# Patient Record
Sex: Female | Born: 1950
Health system: Southern US, Community
[De-identification: ages and names within clinical notes are randomized; demographics above are authoritative.]

## PROBLEM LIST (undated history)

## (undated) DIAGNOSIS — I10 Essential (primary) hypertension: Secondary | ICD-10-CM

## (undated) HISTORY — DX: Essential (primary) hypertension: I10

---

## 2006-06-30 HISTORY — PX: REPLACEMENT TOTAL KNEE: SUR1224

## 2007-05-21 ENCOUNTER — Inpatient Hospital Stay (HOSPITAL_COMMUNITY): Admission: RE | Admit: 2007-05-21 | Discharge: 2007-05-23 | Payer: Self-pay | Admitting: Orthopedic Surgery

## 2010-11-12 NOTE — Op Note (Signed)
Cynthia Knapp, Cynthia Knapp              ACCOUNT NO.:  [id number]   MEDICAL RECORD NO.:  [id number]          PATIENT TYPE:  INP   LOCATION:  0004                         FACILITY:  [facility]   PHYSICIAN:  John L. Rendall, M.D.  DATE OF BIRTH:  1951/04/27   DATE OF PROCEDURE:  05/21/2007  DATE OF DISCHARGE:                               OPERATIVE REPORT   PREOPERATIVE DIAGNOSIS:  Osteoarthritis, right knee, with deformity.   SURGICAL PROCEDURES:  Right LCS total knee arthroplasty with computer  navigation assistance.   POSTOPERATIVE DIAGNOSIS:  Osteoarthritis, right knee, with deformity.   SURGEON:  John L. Rendall, M.D.   ASSISTANT:  Rexene Edison, P.A.-C.   ANESTHESIA:  General.   PATHOLOGY:  Bone on bone medial compartment right knee with varus  deformity and flexion contracture.  Due to the flexion contracture and  varus deformity and young age of the patient, age 52, computer  navigation was felt highly indicated to optimize the results of her  prosthesis.   PROCEDURE:  Under general anesthesia with the femoral nerve block, the  right leg is prepared with DuraPrep and draped as a sterile field.  A  sterile tourniquet is placed on the proximal thigh, the leg is wrapped  out with an Esmarch, and the tourniquet is used to 350 mm.  Midline  incision is made, the patella is everted, and the knee is debrided in  preparation for computer mapping.  Schanz pins are placed in the  superior medial tibia through accessory stab wounds and distal medial  femur.  The arrays are set up.  The femoral head is identified.  Medial  and lateral malleoli are identified.  Proximal tibia is then mapped as  is the distal femur, all within less than 1 mm of reproducible accuracy.  Once this was completed, the computer was used for proximal tibial  resection, taking 10 mm from the high side.  The ligamentous balancer is  then inserted, and significant release of the MCL and medial capsular  sleeve is required  to correct her 7 degrees of varus deformity.  Also  significant release of the cruciates and posterior capsule is required  to extend the knee fully.  Once this is completed and the ligaments are  balanced, attention is then turned to the distal femur, and the anterior  and posterior flare of the distal femur are resected first, using the  computer, within again 1 degree of anatomic accuracy as regards to  rotation.  The distal femoral cut is then made, also within 1 degree of  anatomic accuracy.  Flexion and extension gaps are balanced at slightly  more than 10 mm.  At this point, the recessing guide is used, and the  femoral side is then essentially ready.  Lamina spreader is inserted.  Remnants of menisci and cruciates are debrided.  Proximal tibia is  exposed and sized as a number 2.5.  The femur is sized as a medium.  Once the tibial tray is inserted and the center peg hole is impacted,  the trial reduction of a number 2.5 tibia with a 10 bearing and  a medium  femoral component are used, this reveals excellent stability in  extension but slight laxity in flexion to drawer testing.  Changing out  the bearing for a 12.5 bearing again gives excellent stability in  extension and no hyperextension, and in flexion, stability is much  enhanced.  Anatomic accuracy is within 1.5-mm of perfectly straight line  from hip to ankle, and knee extension is within 2 degrees.  At this,  point the computer arrays are taken down.  The bony surfaces are  prepared with pulse irrigation, and permanent components are then  cemented in place.  Once the cement is hardened, excess cement is  removed, and the tourniquet is let down at an hour and 15 minutes.  The  multiple small vessels are cauterized.  Medium Hemovac is inserted.  The knee is then closed in layers with #1 Tycron, #1 Vicryl, 2-0 Vicryl  and skin clips.  Operative time approximately an hour and 30 minutes.  The patient tolerated the procedure  well and returned to recovery in  good condition.      John L. Rendall, M.D.  Electronically Signed     JLR/MEDQ  D:  05/21/2007  T:  05/22/2007  Job:  [id number]

## 2011-01-10 ENCOUNTER — Other Ambulatory Visit: Payer: Self-pay | Admitting: Orthopedic Surgery

## 2011-01-10 ENCOUNTER — Other Ambulatory Visit (HOSPITAL_COMMUNITY): Payer: Self-pay | Admitting: Orthopedic Surgery

## 2011-01-10 ENCOUNTER — Ambulatory Visit (HOSPITAL_COMMUNITY)
Admission: RE | Admit: 2011-01-10 | Discharge: 2011-01-10 | Disposition: A | Discharge status: Home / Self Care | Payer: Managed Care, Other (non HMO) | Source: Ambulatory Visit | Attending: Orthopedic Surgery | Admitting: Orthopedic Surgery

## 2011-01-10 ENCOUNTER — Encounter (HOSPITAL_COMMUNITY): Payer: Managed Care, Other (non HMO)

## 2011-01-10 DIAGNOSIS — Z01811 Encounter for preprocedural respiratory examination: Secondary | ICD-10-CM

## 2011-01-10 DIAGNOSIS — M1710 Unilateral primary osteoarthritis, unspecified knee: Secondary | ICD-10-CM | POA: Insufficient documentation

## 2011-01-10 DIAGNOSIS — I10 Essential (primary) hypertension: Secondary | ICD-10-CM | POA: Insufficient documentation

## 2011-01-10 DIAGNOSIS — Z01812 Encounter for preprocedural laboratory examination: Secondary | ICD-10-CM | POA: Insufficient documentation

## 2011-01-10 DIAGNOSIS — Z01818 Encounter for other preprocedural examination: Secondary | ICD-10-CM | POA: Insufficient documentation

## 2011-01-10 DIAGNOSIS — IMO0002 Reserved for concepts with insufficient information to code with codable children: Secondary | ICD-10-CM | POA: Insufficient documentation

## 2011-01-10 LAB — URINALYSIS, ROUTINE W REFLEX MICROSCOPIC
Hgb urine dipstick: NEGATIVE
Ketones, ur: NEGATIVE mg/dL
Protein, ur: NEGATIVE mg/dL
pH: 7 (ref 5.0–8.0)

## 2011-01-10 LAB — DIFFERENTIAL
Eosinophils Absolute: 0.1 10*3/uL (ref 0.0–0.7)
Eosinophils Relative: 2 % (ref 0–5)
Lymphocytes Relative: 37 % (ref 12–46)
Monocytes Absolute: 0.6 10*3/uL (ref 0.1–1.0)
Monocytes Relative: 8 % (ref 3–12)
Neutro Abs: 4.5 10*3/uL (ref 1.7–7.7)

## 2011-01-10 LAB — CBC
Platelets: 263 10*3/uL (ref 150–400)
RDW: 13.1 % (ref 11.5–15.5)

## 2011-01-10 LAB — COMPREHENSIVE METABOLIC PANEL
ALT: 25 U/L (ref 0–35)
AST: 23 U/L (ref 0–37)
Alkaline Phosphatase: 93 U/L (ref 39–117)
BUN: 18 mg/dL (ref 6–23)
Calcium: 10 mg/dL (ref 8.4–10.5)
GFR calc Af Amer: >60 mL/min (ref >60)
Sodium: 139 mEq/L (ref 135–145)
Total Protein: 7.9 g/dL (ref 6.0–8.3)

## 2011-01-10 LAB — SURGICAL PCR SCREEN: MRSA, PCR: NEGATIVE

## 2011-01-10 LAB — URINE MICROSCOPIC-ADD ON

## 2011-01-10 LAB — APTT: aPTT: 30 seconds (ref 24–37)

## 2011-01-10 LAB — PROTIME-INR: Prothrombin Time: 12.8 seconds (ref 11.6–15.2)

## 2011-01-16 ENCOUNTER — Inpatient Hospital Stay (HOSPITAL_COMMUNITY)
Admission: RE | Admit: 2011-01-16 | Discharge: 2011-01-18 | DRG: 470 | Disposition: A | Discharge status: Home Health | Payer: Managed Care, Other (non HMO) | Source: Ambulatory Visit | Attending: Orthopedic Surgery | Admitting: Orthopedic Surgery

## 2011-01-16 DIAGNOSIS — M1710 Unilateral primary osteoarthritis, unspecified knee: Principal | ICD-10-CM | POA: Diagnosis present

## 2011-01-16 DIAGNOSIS — Z01812 Encounter for preprocedural laboratory examination: Secondary | ICD-10-CM

## 2011-01-16 DIAGNOSIS — Z96659 Presence of unspecified artificial knee joint: Secondary | ICD-10-CM

## 2011-01-16 DIAGNOSIS — D62 Acute posthemorrhagic anemia: Secondary | ICD-10-CM | POA: Diagnosis not present

## 2011-01-16 DIAGNOSIS — Z6841 Body Mass Index (BMI) 40.0 and over, adult: Secondary | ICD-10-CM

## 2011-01-16 DIAGNOSIS — I10 Essential (primary) hypertension: Secondary | ICD-10-CM | POA: Diagnosis present

## 2011-01-16 LAB — TYPE AND SCREEN

## 2011-01-17 DIAGNOSIS — M79609 Pain in unspecified limb: Secondary | ICD-10-CM

## 2011-01-17 LAB — BASIC METABOLIC PANEL
BUN: 10 mg/dL (ref 6–23)
Calcium: 8.7 mg/dL (ref 8.4–10.5)
Chloride: 106 mEq/L (ref 96–112)
Creatinine, Ser: 0.67 mg/dL (ref 0.50–1.10)
GFR calc Af Amer: >60 mL/min (ref >60)
GFR calc non Af Amer: >60 mL/min (ref >60)
Glucose, Bld: 132 mg/dL — ABNORMAL HIGH (ref 70–99)
Potassium: 4.4 mEq/L (ref 3.5–5.1)

## 2011-01-17 LAB — CBC
MCV: 88.6 fL (ref 78.0–100.0)
RBC: 3.69 MIL/uL — ABNORMAL LOW (ref 3.87–5.11)

## 2011-01-18 LAB — BASIC METABOLIC PANEL
CO2: 28 mEq/L (ref 19–32)
Calcium: 8.3 mg/dL — ABNORMAL LOW (ref 8.4–10.5)
Chloride: 109 mEq/L (ref 96–112)
Creatinine, Ser: 0.65 mg/dL (ref 0.50–1.10)

## 2011-01-18 LAB — CBC
MCH: 29.6 pg (ref 26.0–34.0)
RBC: 3.21 MIL/uL — ABNORMAL LOW (ref 3.87–5.11)
WBC: 9.1 10*3/uL (ref 4.0–10.5)

## 2011-01-23 NOTE — Op Note (Signed)
Cynthia Knapp, Cynthia Knapp              ACCOUNT NO.:  [id number]  MEDICAL RECORD NO.:  [id number]  LOCATION:  0001                         FACILITY:  [facility]  PHYSICIAN:  Cynthia Knapp, M.D.  DATE OF BIRTH:  18-Nov-1950  DATE OF PROCEDURE:  01/16/2011 DATE OF DISCHARGE:                              OPERATIVE REPORT   PREOPERATIVE DIAGNOSIS:  End-stage osteoarthritis, left knee.  SURGICAL PROCEDURES:  Left LCS total knee arthroplasty with computer navigation assistance.  POSTOPERATIVE DIAGNOSIS:  End-stage osteoarthritis, left knee and morbid obesity with body mass index of 43.  SURGEON:  Cynthia Knapp, M.D.  ASSISTANT:  Cynthia Knapp, P.A.  ANESTHESIA:  General.  PATHOLOGY:  The patient has a worn out knee with 7 degrees of varus on the computer and a 5-degree flexion contracture.  In addition, she has morbid obesity with BMI of 42 and a fat layer 4 inches thick over the front of the thigh just above the patella. PROCEDURE:  Under general anesthesia, the left leg is prepared with DuraPrep and draped as a sterile field.  A midline incision was made after application of the sterile tourniquet and Esmarch.  The patella cannot be everted without making a pouch under the fatty layer that is virtually 4 inches thick with it everted in the fatty layer.  The front of the knee is appropriately exposed.  Debridement is then done in preparation for computer mapping.  Schanz pins were placed in punctures of the superior medial tibia and in the distal femur.  The arrays were set up, the femoral heads identified as well as the malleoli.  The knee is then mapped with 1 mm of reproducible accuracy.  Using the first computer guide, the proximal tibia resection is carried out within 1 degree of anatomic alignment.  The flexion gap was then measured.  With the proximal tibia resection done, the tensioner is inserted and the longitudinal alignment is adjusted to within 1 degree of anatomic.   The attention is then turned to the femur.  The anterior and posterior flare of the distal femur are resected for a size medium femoral component. The flexion gap is tested at 10 mm.  The distal femoral cut was then made and this was within 1 degree of anatomic alignment as well. Attention was then turned to debridement of the menisci.  Spurs off the back of the femur and debridement of the intercondylar space.  With this complete, the recessing guide is then used.  With this completed, attention was turned to the tibia.  It is sized at 2.5, center peg hole with keel are inserted and the trial tibial tray, 10 bearing and medium femur are all inserted.  There was excellent fit and alignment is within 1 degree of anatomic and flexion is done easily to about 115 degrees. At this point, the patella was osteotomized, three peg hole patella used.  The patella trial was inserted and the stability and alignment are unchanged.  The arrays were taken down.  Permanent components were obtained.  Bony surfaces prepared with pulse irrigation.  All components cemented in place.  While the cement was hardening, a synovectomy of the anterior compartments were done.  The electrocautery was used and bone wax in preparation for a tourniquet let down.  This was done at 67 minutes.  Once the cement was hardened and excess cement was removed after cauterizing multiple small vessels and inserting a medium Hemovac drain, the knee was then closed in layers with #1 Tycron, two layers proximally of #1 Vicryl and the third layer of 2-0 Vicryl and then skin clips.  Blood loss approximately 200 mL.  The patient tolerated the procedure well and returned to recovery in satisfactory condition.     Cynthia Knapp, M.D.     Cynthia Knapp  D:  01/16/2011  T:  01/16/2011  Job:  [id number]  Electronically Signed by Cynthia Knapp M.D. on 01/23/2011 03:47:23 PM

## 2011-02-06 NOTE — Discharge Summary (Signed)
Cynthia Knapp, BURGGRAF              ACCOUNT NO.:  [id number]  MEDICAL RECORD NO.:  [id number]  LOCATION:  1615                         FACILITY:  [facility]  PHYSICIAN:  Raylin Winer L. Rendall, M.D.  DATE OF BIRTH:  07/20/1950  DATE OF ADMISSION:  01/16/2011 DATE OF DISCHARGE:  01/18/2011                              DISCHARGE SUMMARY   ADMISSION DIAGNOSES: 1. End-stage osteoarthritis, left knee, status post right total knee     arthroplasty. 2. Obesity. 3. Hypertension. 4. Hypercholesterolemia. 5. History of gout.  DISCHARGE DIAGNOSES: 1. End-stage osteoarthritis, left knee, status post left total knee     arthroplasty. 2. Acute blood loss anemia secondary to surgery. 3. Obesity. 4. Hypertension. 5. Hypercholesterolemia. 6. History of gout.  SURGICAL PROCEDURES:  On January 16, 2011, Cynthia Knapp underwent a left total knee arthroplasty with computer navigation by Dr. Jonny Ruiz L. Rendall, assisted by Arnoldo Morale PA-C.  She had a primary femoral component, cemented size medium, left placed with a tibial tray rotating platform NBT keel size 2.5 cemented.  A DePuy LCS complete metal back patella, cemented size medium, with an LCS complete tibial insert rotating platform, 10-mm thickness.  COMPLICATIONS:  None.  CONSULTS:  Physical therapy and occupational therapy consult, January 17, 2011.  HISTORY OF PRESENT ILLNESS:  This 60 year old white female patient with history of a right total knee presented with a 2-year history of gradual onset of progressive left knee pain.  The left knee pain is now constant ache to sharp sensation over the anterior medial knee with radiation into the mid thigh and calf.  It increases with walking and decreases with elevation.  The knee pops, catches, locks, gives away and keeps her up at night.  She has failed conservative treatment and because of that, she is presenting for a left knee replacement.  HOSPITAL COURSE:  Cynthia Knapp tolerated her surgical  procedure well without immediate postoperative complications.  She was transferred to the orthopedic floor.  Postop day #1, she was afebrile, vitals were stable.  Hemoglobin 11, hematocrit 32.7.  She was complaining of some calf pain, so Doppler was ordered which was negative for DVT.  She is taken off her PCA, weaned off her oxygen and started on therapy.  Postop day #2, she was doing very well.  She again remained afebrile, hemoglobin 9.5, hematocrit 28.5.  She was moving well with therapy and was felt to be ready for discharge home and was discharged home later that day.  DISCHARGE INSTRUCTIONS:  DIET:  She is to resume her regular prehospitalization diet.  MEDICATIONS:  Please see the patient medication discharge instruction sheet for complete documentation of the medications.  However, we did start her on Celebrex, Robaxin, Percocet and Xarelto.  We did have her hold her home Lodine that she had been on preoperatively.  ACTIVITY:  She is to be out of bed, weightbearing as tolerated on the left leg with use of a walker.  No lifting or driving for 6 weeks. Please see the white total joint discharge sheet for further activity instructions.  WOUND CARE:  Please see the white total joint discharge sheet for further wound care instructions.  FOLLOWUP:  She is to follow  up with Dr. Priscille Kluver in his office on Tuesday, January 28, 2011 and needs to call [phone] for that appointment. She is arranged for home health PT per [facility].  LABORATORY DATA:  Hemoglobin/hematocrit ranged from 11 and 32.7 on July 20 to 9.5 and 28.5 on the July 21.  Glucose ranged from 132 on July 20 to 106 on July 21.  Calcium dropped to a low of 8.3 on July 21.  All other laboratory studies were within normal limits.     Legrand Pitts Duffy, P.A.   ______________________________ Carlisle Beers. Priscille Kluver, M.D.    KED/MEDQ  D:  01/29/2011  T:  01/29/2011  Job:  [id number]  Electronically Signed by  Otilio Jefferson. on 02/06/2011 02:54:25 PM Electronically Signed by Erasmo Leventhal M.D. on 02/06/2011 03:05:39 PM

## 2011-04-08 LAB — URINALYSIS, ROUTINE W REFLEX MICROSCOPIC
Bilirubin Urine: NEGATIVE
Bilirubin Urine: NEGATIVE
Glucose, UA: NEGATIVE
Hgb urine dipstick: NEGATIVE
Ketones, ur: NEGATIVE
Ketones, ur: NEGATIVE
Nitrite: NEGATIVE
Protein, ur: NEGATIVE
Specific Gravity, Urine: 1.006
Urobilinogen, UA: 0.2
Urobilinogen, UA: 0.2
pH: 5.5

## 2011-04-08 LAB — URINE CULTURE
Colony Count: NO GROWTH
Culture: NO GROWTH

## 2011-04-08 LAB — COMPREHENSIVE METABOLIC PANEL
ALT: 22
AST: 21
Albumin: 4.2
BUN: 8
CO2: 30
Creatinine, Ser: 0.78
GFR calc non Af Amer: >60
Glucose, Bld: 103 — ABNORMAL HIGH
Total Bilirubin: 0.9

## 2011-04-08 LAB — CBC
HCT: 43.3
MCV: 88.2
Platelets: 232
Platelets: 268
RBC: 3.43 — ABNORMAL LOW
RBC: 4.94
RDW: 13.8
WBC: 10.9 — ABNORMAL HIGH
WBC: 11.2 — ABNORMAL HIGH
WBC: 9.3

## 2011-04-08 LAB — BASIC METABOLIC PANEL
BUN: 6
Calcium: 8.6
Chloride: 103
Creatinine, Ser: 0.81
Creatinine, Ser: 0.81
GFR calc Af Amer: >60
GFR calc non Af Amer: >60
Potassium: 4.4
Sodium: 135

## 2011-04-08 LAB — CROSSMATCH
ABO/RH(D): A POS
Antibody Screen: NEGATIVE

## 2011-04-08 LAB — APTT: aPTT: 28

## 2011-04-08 LAB — DIFFERENTIAL
Eosinophils Relative: 1
Monocytes Absolute: 0.6
Monocytes Relative: 7
Neutro Abs: 5.1
Neutrophils Relative %: 55

## 2013-06-02 ENCOUNTER — Other Ambulatory Visit (HOSPITAL_COMMUNITY)
Admission: RE | Admit: 2013-06-02 | Discharge: 2013-06-02 | Disposition: A | Discharge status: Home / Self Care | Payer: No Typology Code available for payment source | Source: Ambulatory Visit | Attending: Family Medicine | Admitting: Family Medicine

## 2013-06-02 ENCOUNTER — Other Ambulatory Visit: Payer: Self-pay | Admitting: Family Medicine

## 2013-06-02 DIAGNOSIS — Z124 Encounter for screening for malignant neoplasm of cervix: Secondary | ICD-10-CM | POA: Insufficient documentation

## 2013-06-02 DIAGNOSIS — Z1151 Encounter for screening for human papillomavirus (HPV): Secondary | ICD-10-CM | POA: Insufficient documentation

## 2014-06-09 ENCOUNTER — Other Ambulatory Visit: Payer: Self-pay | Admitting: Gastroenterology

## 2014-06-09 DIAGNOSIS — R1310 Dysphagia, unspecified: Secondary | ICD-10-CM

## 2014-06-15 ENCOUNTER — Other Ambulatory Visit: Payer: Managed Care, Other (non HMO)

## 2017-09-11 ENCOUNTER — Other Ambulatory Visit: Payer: Self-pay | Admitting: Obstetrics & Gynecology

## 2017-09-11 DIAGNOSIS — Z1231 Encounter for screening mammogram for malignant neoplasm of breast: Secondary | ICD-10-CM

## 2017-09-29 ENCOUNTER — Encounter: Payer: Self-pay | Admitting: Radiology

## 2017-09-29 ENCOUNTER — Ambulatory Visit
Admission: RE | Admit: 2017-09-29 | Discharge: 2017-09-29 | Disposition: A | Discharge status: Home / Self Care | Payer: Commercial Managed Care - PPO | Source: Ambulatory Visit | Attending: Obstetrics & Gynecology | Admitting: Obstetrics & Gynecology

## 2017-09-29 DIAGNOSIS — Z1231 Encounter for screening mammogram for malignant neoplasm of breast: Secondary | ICD-10-CM

## 2017-10-22 ENCOUNTER — Ambulatory Visit: Payer: Commercial Managed Care - PPO

## 2018-08-16 DIAGNOSIS — Z1211 Encounter for screening for malignant neoplasm of colon: Secondary | ICD-10-CM | POA: Diagnosis not present

## 2018-08-16 DIAGNOSIS — F325 Major depressive disorder, single episode, in full remission: Secondary | ICD-10-CM | POA: Diagnosis not present

## 2018-08-16 DIAGNOSIS — E1169 Type 2 diabetes mellitus with other specified complication: Secondary | ICD-10-CM | POA: Diagnosis not present

## 2018-08-16 DIAGNOSIS — E559 Vitamin D deficiency, unspecified: Secondary | ICD-10-CM | POA: Diagnosis not present

## 2018-08-16 DIAGNOSIS — D229 Melanocytic nevi, unspecified: Secondary | ICD-10-CM | POA: Diagnosis not present

## 2018-08-16 DIAGNOSIS — K219 Gastro-esophageal reflux disease without esophagitis: Secondary | ICD-10-CM | POA: Diagnosis not present

## 2018-08-16 DIAGNOSIS — E7800 Pure hypercholesterolemia, unspecified: Secondary | ICD-10-CM | POA: Diagnosis not present

## 2018-08-16 DIAGNOSIS — Z6841 Body Mass Index (BMI) 40.0 and over, adult: Secondary | ICD-10-CM | POA: Diagnosis not present

## 2018-08-16 DIAGNOSIS — I10 Essential (primary) hypertension: Secondary | ICD-10-CM | POA: Diagnosis not present

## 2018-08-25 DIAGNOSIS — L821 Other seborrheic keratosis: Secondary | ICD-10-CM | POA: Diagnosis not present

## 2018-08-25 DIAGNOSIS — D225 Melanocytic nevi of trunk: Secondary | ICD-10-CM | POA: Diagnosis not present

## 2018-08-25 DIAGNOSIS — Z23 Encounter for immunization: Secondary | ICD-10-CM | POA: Diagnosis not present

## 2018-08-25 DIAGNOSIS — D485 Neoplasm of uncertain behavior of skin: Secondary | ICD-10-CM | POA: Diagnosis not present

## 2018-08-25 DIAGNOSIS — Q809 Congenital ichthyosis, unspecified: Secondary | ICD-10-CM | POA: Diagnosis not present

## 2018-08-25 DIAGNOSIS — C44319 Basal cell carcinoma of skin of other parts of face: Secondary | ICD-10-CM | POA: Diagnosis not present

## 2018-08-25 DIAGNOSIS — L814 Other melanin hyperpigmentation: Secondary | ICD-10-CM | POA: Diagnosis not present

## 2018-08-30 DIAGNOSIS — Z23 Encounter for immunization: Secondary | ICD-10-CM | POA: Diagnosis not present

## 2018-08-30 DIAGNOSIS — H04553 Acquired stenosis of bilateral nasolacrimal duct: Secondary | ICD-10-CM | POA: Diagnosis not present

## 2018-08-31 DIAGNOSIS — H10411 Chronic giant papillary conjunctivitis, right eye: Secondary | ICD-10-CM | POA: Diagnosis not present

## 2018-11-15 DIAGNOSIS — M545 Low back pain: Secondary | ICD-10-CM | POA: Diagnosis not present

## 2018-12-01 ENCOUNTER — Other Ambulatory Visit: Payer: Self-pay

## 2018-12-01 ENCOUNTER — Ambulatory Visit: Payer: Medicare Other | Attending: Family Medicine

## 2018-12-01 DIAGNOSIS — S39012D Strain of muscle, fascia and tendon of lower back, subsequent encounter: Secondary | ICD-10-CM | POA: Diagnosis not present

## 2018-12-01 DIAGNOSIS — M62830 Muscle spasm of back: Secondary | ICD-10-CM | POA: Insufficient documentation

## 2018-12-01 DIAGNOSIS — M25551 Pain in right hip: Secondary | ICD-10-CM | POA: Insufficient documentation

## 2018-12-01 NOTE — Patient Instructions (Signed)
1-2x/day 10 eps  Clam with band , hip adduction with pillow squeeze and between feet pillow squeeze.   Use tennis ball for STW to piriformis Levon Hedger

## 2018-12-01 NOTE — Therapy (Signed)
[place], [place], [address] Phone: [phone]   Fax:  [phone]  Physical Therapy Evaluation  Patient Details  Name: Cynthia Knapp MRN: [id number] Date of Birth: 1951-03-02 Referring Provider (PT): Kathyrn Lass MD   Encounter Date: 12/01/2018  PT End of Session - 12/01/18 1219    Visit Number  1    Number of Visits  9    Date for PT Re-Evaluation  12/31/18    Authorization Type  MCR    PT Start Time  1130    PT Stop Time  1220    PT Time Calculation (min)  50 min    Activity Tolerance  Patient tolerated treatment well    Behavior During Therapy  [facility] for tasks assessed/performed       History reviewed. No pertinent past medical history.  History reviewed. No pertinent surgical history.  There were no vitals filed for this visit.   Subjective Assessment - 12/01/18 1135    Subjective  She reports  putting RT leg on bed and putting lotion on but began to have pain in LT back.    Stretching and is doing better. Still move slowly.         Limitations  Lifting   move slow.  limits lifting . slow to start walking   How long can you sit comfortably?  Stiff 30 min    How long can you stand comfortably?  As needed    How long can you walk comfortably?  As needed    Diagnostic tests  Chiropractor did xrays and rpeorted Degenerative changes and twisted    Patient Stated Goals  strengthen back , decompress spine    Currently in Pain?  Yes    Pain Score  2     Pain Location  Back    Pain Orientation  Left;Lower;Posterior    Pain Descriptors / Indicators  Aching    Pain Type  Acute pain    Pain Onset  1 to 4 weeks ago    Pain Frequency  Intermittent   post  ice   Aggravating Factors   sitting  bending , twistiing    Pain Relieving Factors  ice,  stretch, asprin at times    Multiple Pain Sites  No         [facility] PT Assessment - 12/01/18 0001      Assessment   Medical Diagnosis  SI strain    Referring Provider (PT)  Kathyrn Lass MD    Onset Date/Surgical Date  --   2 weeks    Next MD Visit  As needed    Prior Therapy  no PT    Chiropractor. eval but no followup  due to finance      Precautions   Precautions  None      Restrictions   Weight Bearing Restrictions  No      Balance Screen   Has the patient fallen in the past 6 months  No      Prior Function   Level of Independence  Independent    Vocation  Full time employment    Vocation Requirements  Does not affect work.       Cognition   Overall Cognitive Status  Within Functional Limits for tasks assessed      Observation/Other Assessments   Focus on Therapeutic Outcomes (FOTO)   37% limited      Posture/Postural Control   Posture Comments  LT shoulder high  LT pelvis. higher       ROM / Strength   AROM / PROM / Strength  AROM;PROM;Strength      AROM   AROM Assessment Site  Lumbar    Lumbar Flexion  70    Lumbar Extension  20    Lumbar - Right Side Bend  19    Lumbar - Left Side Bend  5    Lumbar - Right Rotation  40    Lumbar - Left Rotation  30      Strength   Overall Strength Comments  WNL in LE       Flexibility   Soft Tissue Assessment /Muscle Length  yes    Hamstrings  80 degrees bilaterla , no incr pain.      Palpation   Palpation comment  Tender Lt piriformis and SI joint/       Ambulation/Gait   Gait Comments  WNL                Objective measurements completed on examination: See above findings.              PT Education - 12/01/18 1238    Education Details  POC HEP , anatomy with spine and pelvis and possible relation to pain    Person(s) Educated  Patient    Methods  Explanation;Demonstration;Tactile cues;Verbal cues;Handout    Comprehension  Verbalized understanding;Returned demonstration          PT Long Term Goals - 12/01/18 1235      PT LONG TERM GOAL #1   Title  She will be indpendent with all hEP issued    Time  4    Period  Weeks    Status   New      PT LONG TERM GOAL #2   Title  She will eport no pain generally and 75% or moe decrease in stiffness and pain with transisitonal movements.     Time  4    Period  Weeks    Status  New      PT LONG TERM GOAL #3   Title  She will eport return to normal lfiting and home activity    Time  4    Period  Weeks    Status  New      PT LONG TERM GOAL #4   Title  Her FOTO score will decr to 20% limited to show perceived  improved function    Time  4    Period  Weeks    Status  New             Plan - 12/01/18 1219    Clinical Impression Statement  Cynthia Knapp presents with rpeort of pain in LT lower back and buttock. It really appears she may have piriformis strain  causing pain at this time . She has some stiffness in spine and this will be addressed as we go along . She has some minor pelvic asymetries but no pain with SI compression and sacral shear.   She should do well with skilled PT and HEp     Stability/Clinical Decision Making  Stable/Uncomplicated    Clinical Decision Making  Low    Rehab Potential  Good    PT Frequency  2x / week    PT Duration  4 weeks    PT Treatment/Interventions  Cryotherapy;Moist Heat;Iontophoresis 4mg /ml Dexamethasone;Ultrasound;Therapeutic exercise;Patient/family education;Manual techniques;Dry needling;Passive range of motion;Taping    PT Next Visit Plan  REview HEP,  manual and modalities for pain.       PT Home Exercise Plan  Hip IR /ER  and adduction STW with tennis ball    Consulted and Agree with Plan of Care  Patient       Patient will benefit from skilled therapeutic intervention in order to improve the following deficits and impairments:     Visit Diagnosis: Sacroiliac strain, subsequent encounter  Muscle spasm of back  Pain in right hip     Problem List There are no active problems to display for this patient.   Darrel Hoover   PT 12/01/2018, 12:39 PM  [facility] [address] [place], [place], [address] Phone: [phone]   Fax:  [phone]  Name: Cynthia Knapp MRN: [id number] Date of Birth: 1951/05/10

## 2018-12-06 ENCOUNTER — Ambulatory Visit: Payer: Medicare Other | Admitting: Physical Therapy

## 2018-12-06 ENCOUNTER — Other Ambulatory Visit: Payer: Self-pay

## 2018-12-06 ENCOUNTER — Encounter: Payer: Self-pay | Admitting: Physical Therapy

## 2018-12-06 DIAGNOSIS — M25551 Pain in right hip: Secondary | ICD-10-CM | POA: Diagnosis not present

## 2018-12-06 DIAGNOSIS — M62830 Muscle spasm of back: Secondary | ICD-10-CM

## 2018-12-06 DIAGNOSIS — S39012D Strain of muscle, fascia and tendon of lower back, subsequent encounter: Secondary | ICD-10-CM

## 2018-12-06 NOTE — Therapy (Addendum)
[place], [place], [address] Phone: [phone]   Fax:  [phone]  Physical Therapy Treatment/Discharge  Patient Details  Name: Cynthia Knapp MRN: [id number] Date of Birth: Apr 29, 1951 Referring Provider (PT): Kathyrn Lass MD   Encounter Date: 12/06/2018  PT End of Session - 12/06/18 1134    Visit Number  2    Number of Visits  9    Date for PT Re-Evaluation  12/31/18    Authorization Type  MCR    PT Start Time  1130    PT Stop Time  1215    PT Time Calculation (min)  45 min       History reviewed. No pertinent past medical history.  History reviewed. No pertinent surgical history.  There were no vitals filed for this visit.  Subjective Assessment - 12/06/18 1134    Subjective  No pain now. Moving a little better. Pain after sitting 15-20 minutes.     Currently in Pain?  No/denies                       [facility] Adult PT Treatment/Exercise - 12/06/18 0001      Exercises   Exercises  Knee/Hip      Knee/Hip Exercises: Stretches   Active Hamstring Stretch  3 reps;30 seconds    Active Hamstring Stretch Limitations  standing, seated, supine variations -she performs independently at home.     Other Knee/Hip Stretches  knee to opp shoulder, figure 4 3 x 30 sec each bilateral -HEP       Knee/Hip Exercises: Supine   Bridges  10 reps    Bridges with [employer]  15 reps    Bridges with Clamshell  15 reps    Other Supine Knee/Hip Exercises  ball squeeze x 10 - education provided on abdominal recruitment and coordinated breathing.     Other Supine Knee/Hip Exercises  clam with black band- education provided on abdominal recruitment and coordinated breathing             PT Education - 12/06/18 1244    Education Details  HEP    Person(s) Educated  Patient    Methods  Explanation;Handout    Comprehension  Verbalized understanding          PT Long Term Goals - 12/01/18 1235       PT LONG TERM GOAL #1   Title  She will be indpendent with all hEP issued    Time  4    Period  Weeks    Status  New      PT LONG TERM GOAL #2   Title  She will eport no pain generally and 75% or moe decrease in stiffness and pain with transisitonal movements.     Time  4    Period  Weeks    Status  New      PT LONG TERM GOAL #3   Title  She will eport return to normal lfiting and home activity    Time  4    Period  Weeks    Status  New      PT LONG TERM GOAL #4   Title  Her FOTO score will decr to 20% limited to show perceived  improved function    Time  4    Period  Weeks    Status  New            Plan - 12/06/18 1143  Clinical Impression Statement  Pt reports no dificulty with HEP. Increased lumbar burning with resisted clam. Pain decreased after education on abdominal recrutiment. Progressed HEP to include bridge and abdominal recruitment. Also added hip stretching. Pt to continue her own stretches which include seated and standing side bend, seated hamstring stretch and standing lumbar extension. She reported a good work out after session.     PT Next Visit Plan  REview HEP,   manual and modalities for pain.       PT Home Exercise Plan  Hip IR /ER  and adduction STW with tennis ball, bridge with ball, bridge with clam, hip er/ir stretches     Consulted and Agree with Plan of Care  Patient       Patient will benefit from skilled therapeutic intervention in order to improve the following deficits and impairments:     Visit Diagnosis: Sacroiliac strain, subsequent encounter  Muscle spasm of back  Pain in right hip     Problem List There are no active problems to display for this patient.   Dorene Ar, [place] 12/06/2018, 12:45 PM  [place], [place], [address] Phone: [phone]   Fax:  [phone]  Name: Billijo Dilling MRN: [id number] Date of Birth:  Feb 14, 1951   PHYSICAL THERAPY DISCHARGE SUMMARY  Visits from Start of Care: 2  Current functional level related to goals / functional outcomes: Unknown See above. She canceled all appointments    Remaining deficits: Unknown   Education / Equipment: HEP Plan: Patient agrees to discharge.  Patient goals were partially met. Patient is being discharged due to the patient's request.  ?????   Pearson Forster PT     01/13/19

## 2018-12-08 ENCOUNTER — Other Ambulatory Visit: Payer: Self-pay

## 2018-12-08 ENCOUNTER — Ambulatory Visit: Payer: Medicare Other

## 2018-12-08 ENCOUNTER — Ambulatory Visit
Admission: RE | Admit: 2018-12-08 | Discharge: 2018-12-08 | Disposition: A | Discharge status: Home / Self Care | Payer: Medicare Other | Source: Ambulatory Visit | Attending: Family Medicine | Admitting: Family Medicine

## 2018-12-08 ENCOUNTER — Other Ambulatory Visit: Payer: Self-pay | Admitting: Family Medicine

## 2018-12-08 DIAGNOSIS — R1031 Right lower quadrant pain: Secondary | ICD-10-CM | POA: Diagnosis not present

## 2018-12-08 DIAGNOSIS — Z6841 Body Mass Index (BMI) 40.0 and over, adult: Secondary | ICD-10-CM | POA: Diagnosis not present

## 2018-12-08 DIAGNOSIS — K4020 Bilateral inguinal hernia, without obstruction or gangrene, not specified as recurrent: Secondary | ICD-10-CM | POA: Diagnosis not present

## 2018-12-08 MED ORDER — IOPAMIDOL (ISOVUE-300) INJECTION 61%
100.0000 mL | Freq: Once | INTRAVENOUS | Status: AC | PRN
  Administered 2018-12-08: 11:00:00 125 mL via INTRAVENOUS

## 2018-12-13 ENCOUNTER — Ambulatory Visit: Payer: Medicare Other | Admitting: Physical Therapy

## 2018-12-15 ENCOUNTER — Ambulatory Visit: Payer: Medicare Other | Admitting: Physical Therapy

## 2018-12-20 ENCOUNTER — Ambulatory Visit: Payer: Medicare Other | Admitting: Physical Therapy

## 2018-12-22 ENCOUNTER — Ambulatory Visit: Payer: Medicare Other

## 2018-12-27 ENCOUNTER — Ambulatory Visit: Payer: Medicare Other | Admitting: Physical Therapy

## 2019-01-06 DIAGNOSIS — C44319 Basal cell carcinoma of skin of other parts of face: Secondary | ICD-10-CM | POA: Diagnosis not present

## 2019-03-04 DIAGNOSIS — I10 Essential (primary) hypertension: Secondary | ICD-10-CM | POA: Diagnosis not present

## 2019-03-04 DIAGNOSIS — Z0000 Encounter for general adult medical examination without abnormal findings: Secondary | ICD-10-CM | POA: Diagnosis not present

## 2019-03-04 DIAGNOSIS — Z6841 Body Mass Index (BMI) 40.0 and over, adult: Secondary | ICD-10-CM | POA: Diagnosis not present

## 2019-03-04 DIAGNOSIS — Z780 Asymptomatic menopausal state: Secondary | ICD-10-CM | POA: Diagnosis not present

## 2019-03-04 DIAGNOSIS — M1A0710 Idiopathic chronic gout, right ankle and foot, without tophus (tophi): Secondary | ICD-10-CM | POA: Diagnosis not present

## 2019-03-04 DIAGNOSIS — Z23 Encounter for immunization: Secondary | ICD-10-CM | POA: Diagnosis not present

## 2019-03-04 DIAGNOSIS — E7800 Pure hypercholesterolemia, unspecified: Secondary | ICD-10-CM | POA: Diagnosis not present

## 2019-03-04 DIAGNOSIS — E1169 Type 2 diabetes mellitus with other specified complication: Secondary | ICD-10-CM | POA: Diagnosis not present

## 2019-03-04 DIAGNOSIS — E559 Vitamin D deficiency, unspecified: Secondary | ICD-10-CM | POA: Diagnosis not present

## 2019-03-04 DIAGNOSIS — K219 Gastro-esophageal reflux disease without esophagitis: Secondary | ICD-10-CM | POA: Diagnosis not present

## 2019-03-10 ENCOUNTER — Other Ambulatory Visit: Payer: Self-pay | Admitting: Family Medicine

## 2019-03-10 DIAGNOSIS — M810 Age-related osteoporosis without current pathological fracture: Secondary | ICD-10-CM

## 2019-04-13 DIAGNOSIS — Z1159 Encounter for screening for other viral diseases: Secondary | ICD-10-CM | POA: Diagnosis not present

## 2019-04-18 DIAGNOSIS — K5730 Diverticulosis of large intestine without perforation or abscess without bleeding: Secondary | ICD-10-CM | POA: Diagnosis not present

## 2019-04-18 DIAGNOSIS — Z1211 Encounter for screening for malignant neoplasm of colon: Secondary | ICD-10-CM | POA: Diagnosis not present

## 2019-04-18 DIAGNOSIS — D123 Benign neoplasm of transverse colon: Secondary | ICD-10-CM | POA: Diagnosis not present

## 2019-04-20 DIAGNOSIS — D123 Benign neoplasm of transverse colon: Secondary | ICD-10-CM | POA: Diagnosis not present

## 2019-05-18 ENCOUNTER — Other Ambulatory Visit: Payer: PRIVATE HEALTH INSURANCE

## 2019-06-20 ENCOUNTER — Other Ambulatory Visit: Payer: Self-pay

## 2019-06-20 ENCOUNTER — Ambulatory Visit
Admission: RE | Admit: 2019-06-20 | Discharge: 2019-06-20 | Disposition: A | Discharge status: Home / Self Care | Payer: Medicare Other | Source: Ambulatory Visit | Attending: Family Medicine | Admitting: Family Medicine

## 2019-06-20 DIAGNOSIS — M810 Age-related osteoporosis without current pathological fracture: Secondary | ICD-10-CM

## 2019-06-20 DIAGNOSIS — Z780 Asymptomatic menopausal state: Secondary | ICD-10-CM | POA: Diagnosis not present

## 2019-06-20 DIAGNOSIS — Z13820 Encounter for screening for osteoporosis: Secondary | ICD-10-CM | POA: Diagnosis not present

## 2019-08-20 ENCOUNTER — Ambulatory Visit: Payer: Medicare Other | Attending: Internal Medicine

## 2019-08-20 DIAGNOSIS — Z23 Encounter for immunization: Secondary | ICD-10-CM

## 2019-08-20 NOTE — Progress Notes (Signed)
   Covid-19 Vaccination Clinic  Name:  Socorro Willison    MRN: [id number] DOB: Jul 28, 1950  08/20/2019  Ms. Madan was observed post Covid-19 immunization for 15 minutes without incidence. She was provided with Vaccine Information Sheet and instruction to access the V-Safe system.   Ms. Willmon was instructed to call 911 with any severe reactions post vaccine: Marland Kitchen Difficulty breathing  . Swelling of your face and throat  . A fast heartbeat  . A bad rash all over your body  . Dizziness and weakness    Immunizations Administered    Name Date Dose VIS Date Route   Pfizer COVID-19 Vaccine 08/20/2019  8:41 AM 0.3 mL 06/10/2019 Intramuscular   Manufacturer: [place]   Lot: [id number]   [place]: [id number]

## 2019-08-31 DIAGNOSIS — E1169 Type 2 diabetes mellitus with other specified complication: Secondary | ICD-10-CM | POA: Diagnosis not present

## 2019-08-31 DIAGNOSIS — E7800 Pure hypercholesterolemia, unspecified: Secondary | ICD-10-CM | POA: Diagnosis not present

## 2019-08-31 DIAGNOSIS — K219 Gastro-esophageal reflux disease without esophagitis: Secondary | ICD-10-CM | POA: Diagnosis not present

## 2019-08-31 DIAGNOSIS — I10 Essential (primary) hypertension: Secondary | ICD-10-CM | POA: Diagnosis not present

## 2019-08-31 DIAGNOSIS — M2550 Pain in unspecified joint: Secondary | ICD-10-CM | POA: Diagnosis not present

## 2019-09-08 ENCOUNTER — Other Ambulatory Visit: Payer: Self-pay

## 2019-09-08 DIAGNOSIS — R930 Abnormal findings on diagnostic imaging of skull and head, not elsewhere classified: Secondary | ICD-10-CM | POA: Diagnosis not present

## 2019-09-08 DIAGNOSIS — I6523 Occlusion and stenosis of bilateral carotid arteries: Secondary | ICD-10-CM | POA: Diagnosis not present

## 2019-09-08 DIAGNOSIS — I10 Essential (primary) hypertension: Secondary | ICD-10-CM | POA: Diagnosis not present

## 2019-09-08 DIAGNOSIS — E119 Type 2 diabetes mellitus without complications: Secondary | ICD-10-CM | POA: Insufficient documentation

## 2019-09-08 DIAGNOSIS — Z7984 Long term (current) use of oral hypoglycemic drugs: Secondary | ICD-10-CM | POA: Diagnosis not present

## 2019-09-08 DIAGNOSIS — R519 Headache, unspecified: Secondary | ICD-10-CM | POA: Insufficient documentation

## 2019-09-08 DIAGNOSIS — Z79899 Other long term (current) drug therapy: Secondary | ICD-10-CM | POA: Insufficient documentation

## 2019-09-08 LAB — COMPREHENSIVE METABOLIC PANEL
ALT: 36 U/L (ref 0–44)
AST: 28 U/L (ref 15–41)
Albumin: 4.5 g/dL (ref 3.5–5.0)
Alkaline Phosphatase: 77 U/L (ref 38–126)
Anion gap: 10 (ref 5–15)
BUN: 19 mg/dL (ref 8–23)
CO2: 26 mmol/L (ref 22–32)
Calcium: 9.9 mg/dL (ref 8.9–10.3)
Chloride: 103 mmol/L (ref 98–111)
Creatinine, Ser: 0.79 mg/dL (ref 0.44–1.00)
GFR calc Af Amer: >60 mL/min (ref >60)
GFR calc non Af Amer: >60 mL/min (ref >60)
Glucose, Bld: 131 mg/dL — ABNORMAL HIGH (ref 70–99)
Potassium: 4.8 mmol/L (ref 3.5–5.1)
Sodium: 139 mmol/L (ref 135–145)
Total Bilirubin: 0.6 mg/dL (ref 0.3–1.2)
Total Protein: 7.9 g/dL (ref 6.5–8.1)

## 2019-09-08 LAB — TROPONIN I (HIGH SENSITIVITY): Troponin I (High Sensitivity): <2 ng/L (ref <18)

## 2019-09-08 LAB — CBC
HCT: 43.7 % (ref 36.0–46.0)
Hemoglobin: 14.2 g/dL (ref 12.0–15.0)
MCH: 29.5 pg (ref 26.0–34.0)
MCHC: 32.5 g/dL (ref 30.0–36.0)
MCV: 90.7 fL (ref 80.0–100.0)
Platelets: 289 10*3/uL (ref 150–400)
RBC: 4.82 MIL/uL (ref 3.87–5.11)
RDW: 13.1 % (ref 11.5–15.5)
WBC: 9.8 10*3/uL (ref 4.0–10.5)
nRBC: 0 % (ref 0.0–0.2)

## 2019-09-08 NOTE — ED Triage Notes (Signed)
Pt states she went for routine check up last week and noted her bp was elevated. She was instructed to check it regularly and her bp today was 223/128. Pt is on metoprolol 100 mg, was told to take an extra 50 mg tab. Pt is co headache and right sided neck stiffness.

## 2019-09-09 ENCOUNTER — Emergency Department: Payer: Medicare Other

## 2019-09-09 ENCOUNTER — Emergency Department
Admission: EM | Admit: 2019-09-09 | Discharge: 2019-09-09 | Disposition: A | Discharge status: Home / Self Care | Payer: Medicare Other | Attending: Emergency Medicine | Admitting: Emergency Medicine

## 2019-09-09 DIAGNOSIS — E119 Type 2 diabetes mellitus without complications: Secondary | ICD-10-CM | POA: Diagnosis not present

## 2019-09-09 DIAGNOSIS — I10 Essential (primary) hypertension: Secondary | ICD-10-CM

## 2019-09-09 DIAGNOSIS — R930 Abnormal findings on diagnostic imaging of skull and head, not elsewhere classified: Secondary | ICD-10-CM | POA: Diagnosis not present

## 2019-09-09 DIAGNOSIS — I6523 Occlusion and stenosis of bilateral carotid arteries: Secondary | ICD-10-CM | POA: Diagnosis not present

## 2019-09-09 LAB — TROPONIN I (HIGH SENSITIVITY): Troponin I (High Sensitivity): 3 ng/L (ref <18)

## 2019-09-09 MED ORDER — AMLODIPINE BESYLATE 5 MG PO TABS
10.0000 mg | ORAL_TABLET | Freq: Once | ORAL | Status: AC
  Administered 2019-09-09: 10 mg via ORAL
  Filled 2019-09-09: qty 2

## 2019-09-09 MED ORDER — SODIUM CHLORIDE 0.9 % IV BOLUS
500.0000 mL | Freq: Once | INTRAVENOUS | Status: AC
  Administered 2019-09-09: 500 mL via INTRAVENOUS

## 2019-09-09 MED ORDER — ACETAMINOPHEN 500 MG PO TABS
1000.0000 mg | ORAL_TABLET | Freq: Once | ORAL | Status: AC
  Administered 2019-09-09: 1000 mg via ORAL
  Filled 2019-09-09: qty 2

## 2019-09-09 MED ORDER — AMLODIPINE BESYLATE 10 MG PO TABS: 10.0000 mg | ORAL_TABLET | Freq: Every day | ORAL | 0 refills | Status: DC

## 2019-09-09 MED ORDER — IOHEXOL 350 MG/ML SOLN
75.0000 mL | Freq: Once | INTRAVENOUS | Status: AC | PRN
  Administered 2019-09-09: 75 mL via INTRAVENOUS

## 2019-09-09 NOTE — Discharge Instructions (Addendum)
Thank you for letting us take care of you in the emergency department today.   Please continue to take any regular, prescribed medications.   New medications we have prescribed:  Amlodipine - blood pressure medication  Please follow up with: Your primary care doctor to review your ER visit and follow up on your symptoms.    Please return to the ER for any new or worsening symptoms.

## 2019-09-09 NOTE — ED Notes (Signed)
Pt complaining of headache after taking amlodipine and subsequent drop in BP. Dr Joni Fears notified. Received verbal orders for 1000mg  of Tylenol

## 2019-09-09 NOTE — ED Provider Notes (Signed)
[facility] Emergency Department Provider Note  ____________________________________________  Time seen: Approximately 2:13 AM  I have reviewed the triage vital signs and the nursing notes.   HISTORY  Chief Complaint Hypertension    HPI Cynthia Knapp is a 69 y.o. female with a past history of diabetes and hypertension that is controlled by metoprolol who comes to the ED today due to severely elevated blood pressures of up to 220/130 today, associated with right neck pain radiating into the head for the past 2 days.  The neck pain is worse with turning her head.  No alleviating factors.  No vision changes but she does feel like she has been a little off balance recently.   No paresthesias or weakness.  No falls or head trauma.  Patient also notes that 3:00 PM she had an episode of chest discomfort that was mild, nonradiating, no aggravating or alleviating factors, no shortness of breath diaphoresis or vomiting, lasted 10 minutes, resolved spontaneously.  Felt dull.   Past medical history includes hypertension, diabetes, GERD   There are no problems to display for this patient.    No past surgical history on file.   Prior to Admission medications   Medication Sig Start Date End Date Taking? Authorizing Provider  acetaminophen (TYLENOL) 325 MG tablet Take 650 mg by mouth every 6 (six) hours as needed.    [provider]  amLODipine (NORVASC) 10 MG tablet Take 1 tablet (10 mg total) by mouth daily. 09/09/19 10/09/19  Carrie Mew, MD  metFORMIN (GLUCOPHAGE) 500 MG tablet Take by mouth 2 (two) times daily with a meal.    [provider]  metoprolol succinate (TOPROL-XL) 100 MG 24 hr tablet Take 100 mg by mouth daily. Take with or immediately following a meal.    [provider]  omeprazole (PRILOSEC) 10 MG capsule Take 10 mg by mouth daily.    [provider]  pantoprazole (PROTONIX) 40 MG tablet Take 40 mg by mouth  daily.    [provider]  Vitamin D, Ergocalciferol, (DRISDOL) 1.25 MG (50000 UT) CAPS capsule Take 50,000 Units by mouth every 7 (seven) days.    [provider]     Allergies Patient has no allergy information on record.   No family history on file.  Social History Social History   Tobacco Use  . Smoking status: Not on file  Substance Use Topics  . Alcohol use: Not on file  . Drug use: Not on file  No tobacco or alcohol use  Review of Systems  Constitutional:   No fever or chills.  ENT:   No sore throat. No rhinorrhea. Cardiovascular:   Positive chest discomfort as above without syncope. Respiratory:   No dyspnea or cough. Gastrointestinal:   Negative for abdominal pain, vomiting and diarrhea.  Musculoskeletal:   Right neck pain as above All other systems reviewed and are negative except as documented above in ROS and HPI.  ____________________________________________   PHYSICAL EXAM:  VITAL SIGNS: ED Triage Vitals [09/08/19 2211]  Enc Vitals Group     BP (!) 204/113     Pulse Rate 60     Resp 20     Temp 98.2 F (36.8 C)     Temp Source Oral     SpO2 98 %     Weight 230 lb (104.3 kg)     Height 5\' 2"  (1.575 m)     Head Circumference      Peak Flow  Pain Score 5     Pain Loc      Pain Edu?      Excl. in [place]?     Vital signs reviewed, nursing assessments reviewed.   Constitutional:   Alert and oriented. Non-toxic appearance. Eyes:   Conjunctivae are normal. EOMI. PERRL. ENT      Head:   Normocephalic and atraumatic.      Nose:   Wearing a mask.      Mouth/Throat:   Wearing a mask.      Neck:   No meningismus. Full ROM.  There is tenderness along the right paraspinous musculature, not along the sternocleidomastoid.  No inflammatory changes or swelling. Hematological/Lymphatic/Immunilogical:   No cervical lymphadenopathy. Cardiovascular:   RRR. Symmetric bilateral radial and DP pulses.  No murmurs. Cap refill less than 2  seconds. Respiratory:   Normal respiratory effort without tachypnea/retractions. Breath sounds are clear and equal bilaterally. No wheezes/rales/rhonchi.  Musculoskeletal:   Normal range of motion in all extremities. No joint effusions.  No lower extremity tenderness.  No edema. Neurologic:   Normal speech and language.  Cranial nerves III through XII intact Motor grossly intact. Normal cerebellar function No acute focal neurologic deficits are appreciated.  Skin:    Skin is warm, dry and intact. No rash noted.  No petechiae, purpura, or bullae.  ____________________________________________    LABS (pertinent positives/negatives) (all labs ordered are listed, but only abnormal results are displayed) Labs Reviewed  COMPREHENSIVE METABOLIC PANEL - Abnormal; Notable for the following components:      Result Value   Glucose, Bld 131 (*)    All other components within normal limits  CBC  TROPONIN I (HIGH SENSITIVITY)  TROPONIN I (HIGH SENSITIVITY)   ____________________________________________   EKG  Interpreted by me Sinus bradycardia rate of 59, left axis, normal intervals.  Normal QRS ST segments and T waves.  ____________________________________________    RADIOLOGY  CT Angio Head W or Wo Contrast  Result Date: 09/09/2019 CLINICAL DATA:  Initial evaluation for acute headache, normal neurologic exam. EXAM: CT ANGIOGRAPHY HEAD AND NECK TECHNIQUE: Multidetector CT imaging of the head and neck was performed using the standard protocol during bolus administration of intravenous contrast. Multiplanar CT image reconstructions and MIPs were obtained to evaluate the vascular anatomy. Carotid stenosis measurements (when applicable) are obtained utilizing NASCET criteria, using the distal internal carotid diameter as the denominator. CONTRAST:  21mL OMNIPAQUE IOHEXOL 350 MG/ML SOLN COMPARISON:  None. FINDINGS: CT HEAD FINDINGS Brain: Cerebral volume within normal limits for age. There is  a single 5 mm hyperdensity positioned at the left globus pallidus, somewhat indeterminate, and could reflect focal calcification/mineralization versus a tiny microhemorrhage (series 3, image 10). No other evidence for acute intracranial hemorrhage. No acute large vessel territory infarct. No mass lesion, midline shift or mass effect. No hydrocephalus. No extra-axial fluid collection. Vascular: No hyperdense vessel. Skull: Scalp soft tissues within normal limits. Calvarium intact. Sinuses: Paranasal sinuses and mastoid air cells are clear. Orbits: Globes and orbital soft tissues within normal limits. CTA NECK FINDINGS Aortic arch: Visualized aortic arch of normal caliber with normal branch pattern. Mild-to-moderate atheromatous change about the arch and origin of the great vessels without hemodynamically significant stenosis. Visualized subclavian arteries widely patent. Right carotid system: Right common carotid artery widely patent from its origin to the bifurcation without stenosis. Mild eccentric plaque at the right bifurcation without hemodynamically significant stenosis. Right ICA widely patent distally to the skull base without stenosis, dissection or occlusion. Left carotid  system: Left common carotid artery widely patent from its origin to the bifurcation. Mild scattered atheromatous plaque about the left bifurcation without hemodynamically significant stenosis. Left ICA widely patent distally to the skull base without stenosis, dissection or occlusion. Vertebral arteries: Both vertebral arteries arise from the subclavian arteries. Vertebral arteries widely patent within the neck without stenosis, dissection, or occlusion. Distal right V3 segment is duplicated. Skeleton: No acute osseous abnormality. No discrete or worrisome osseous lesions. Moderate cervical spondylosis noted at C5-6 and C6-7. Other neck: No other acute soft tissue abnormality within the neck. No mass lesion or adenopathy. Upper chest:  Visualized upper chest demonstrates no acute finding. Review of the MIP images confirms the above findings CTA HEAD FINDINGS Anterior circulation: Petrous segments widely patent. Scattered atheromatous plaque within the cavernous/supraclinoid ICAs with associated mild to moderate multifocal narrowing. ICA termini well perfused. A1 segments patent bilaterally. Normal anterior communicating artery. Anterior cerebral arteries widely patent to their distal aspects without stenosis. No M1 stenosis or occlusion. Normal MCA bifurcations. Distal MCA branches well perfused and symmetric. Posterior circulation: Right vertebral artery widely patent to the vertebrobasilar junction without stenosis. Focal non stenotic plaque noted within the proximal left V4 segment without significant stenosis. Left vertebral otherwise widely patent. Both picas patent. Basilar tortuous but widely patent to its distal aspect without stenosis. Superior cerebral arteries patent bilaterally. Both PCAs primarily supplied via the basilar well perfused or distal aspects. Venous sinuses: Patent. Anatomic variants: None significant. No aneurysm or other vascular abnormality. Review of the MIP images confirms the above findings IMPRESSION: CT HEAD IMPRESSION: 1. 5 mm hyperdensity positioned at the left globus pallidus, indeterminate. While this finding is favored to reflect a small focus of mineralization/calcification, a small micro bleed is not entirely excluded. Short interval follow-up CT in 4-6 hours recommended for further evaluation. 2. Otherwise negative head CT. No other acute intracranial abnormality. CTA HEAD AND NECK IMPRESSION: 1. Negative CTA for large vessel occlusion, dissection, or other acute vascular abnormality. 2. Mild-to-moderate atherosclerotic change about the aortic arch, carotid bifurcations, and carotid siphons without significant stenosis. No proximal high-grade or correctable stenosis identified. Critical Value/emergent  results were called by telephone at the time of interpretation on 09/09/2019 at 3:04 am to provider Keani Gotcher , who verbally acknowledged these results. Electronically Signed   By: Jeannine Boga M.D.   On: 09/09/2019 03:07   CT Angio Neck W and/or Wo Contrast  Result Date: 09/09/2019 CLINICAL DATA:  Initial evaluation for acute headache, normal neurologic exam. EXAM: CT ANGIOGRAPHY HEAD AND NECK TECHNIQUE: Multidetector CT imaging of the head and neck was performed using the standard protocol during bolus administration of intravenous contrast. Multiplanar CT image reconstructions and MIPs were obtained to evaluate the vascular anatomy. Carotid stenosis measurements (when applicable) are obtained utilizing NASCET criteria, using the distal internal carotid diameter as the denominator. CONTRAST:  96mL OMNIPAQUE IOHEXOL 350 MG/ML SOLN COMPARISON:  None. FINDINGS: CT HEAD FINDINGS Brain: Cerebral volume within normal limits for age. There is a single 5 mm hyperdensity positioned at the left globus pallidus, somewhat indeterminate, and could reflect focal calcification/mineralization versus a tiny microhemorrhage (series 3, image 10). No other evidence for acute intracranial hemorrhage. No acute large vessel territory infarct. No mass lesion, midline shift or mass effect. No hydrocephalus. No extra-axial fluid collection. Vascular: No hyperdense vessel. Skull: Scalp soft tissues within normal limits. Calvarium intact. Sinuses: Paranasal sinuses and mastoid air cells are clear. Orbits: Globes and orbital soft tissues within normal limits. CTA NECK  FINDINGS Aortic arch: Visualized aortic arch of normal caliber with normal branch pattern. Mild-to-moderate atheromatous change about the arch and origin of the great vessels without hemodynamically significant stenosis. Visualized subclavian arteries widely patent. Right carotid system: Right common carotid artery widely patent from its origin to the  bifurcation without stenosis. Mild eccentric plaque at the right bifurcation without hemodynamically significant stenosis. Right ICA widely patent distally to the skull base without stenosis, dissection or occlusion. Left carotid system: Left common carotid artery widely patent from its origin to the bifurcation. Mild scattered atheromatous plaque about the left bifurcation without hemodynamically significant stenosis. Left ICA widely patent distally to the skull base without stenosis, dissection or occlusion. Vertebral arteries: Both vertebral arteries arise from the subclavian arteries. Vertebral arteries widely patent within the neck without stenosis, dissection, or occlusion. Distal right V3 segment is duplicated. Skeleton: No acute osseous abnormality. No discrete or worrisome osseous lesions. Moderate cervical spondylosis noted at C5-6 and C6-7. Other neck: No other acute soft tissue abnormality within the neck. No mass lesion or adenopathy. Upper chest: Visualized upper chest demonstrates no acute finding. Review of the MIP images confirms the above findings CTA HEAD FINDINGS Anterior circulation: Petrous segments widely patent. Scattered atheromatous plaque within the cavernous/supraclinoid ICAs with associated mild to moderate multifocal narrowing. ICA termini well perfused. A1 segments patent bilaterally. Normal anterior communicating artery. Anterior cerebral arteries widely patent to their distal aspects without stenosis. No M1 stenosis or occlusion. Normal MCA bifurcations. Distal MCA branches well perfused and symmetric. Posterior circulation: Right vertebral artery widely patent to the vertebrobasilar junction without stenosis. Focal non stenotic plaque noted within the proximal left V4 segment without significant stenosis. Left vertebral otherwise widely patent. Both picas patent. Basilar tortuous but widely patent to its distal aspect without stenosis. Superior cerebral arteries patent bilaterally.  Both PCAs primarily supplied via the basilar well perfused or distal aspects. Venous sinuses: Patent. Anatomic variants: None significant. No aneurysm or other vascular abnormality. Review of the MIP images confirms the above findings IMPRESSION: CT HEAD IMPRESSION: 1. 5 mm hyperdensity positioned at the left globus pallidus, indeterminate. While this finding is favored to reflect a small focus of mineralization/calcification, a small micro bleed is not entirely excluded. Short interval follow-up CT in 4-6 hours recommended for further evaluation. 2. Otherwise negative head CT. No other acute intracranial abnormality. CTA HEAD AND NECK IMPRESSION: 1. Negative CTA for large vessel occlusion, dissection, or other acute vascular abnormality. 2. Mild-to-moderate atherosclerotic change about the aortic arch, carotid bifurcations, and carotid siphons without significant stenosis. No proximal high-grade or correctable stenosis identified. Critical Value/emergent results were called by telephone at the time of interpretation on 09/09/2019 at 3:04 am to provider Pennye Beeghly , who verbally acknowledged these results. Electronically Signed   By: Jeannine Boga M.D.   On: 09/09/2019 03:07    ____________________________________________   PROCEDURES Procedures  ____________________________________________  DIFFERENTIAL DIAGNOSIS   Symptomatic hypertension, musculoskeletal neck pain, vertebral artery dissection  CLINICAL IMPRESSION / ASSESSMENT AND PLAN / ED COURSE  Medications ordered in the ED: Medications  sodium chloride 0.9 % bolus 500 mL (0 mLs Intravenous Stopped 09/09/19 0227)  amLODipine (NORVASC) tablet 10 mg (10 mg Oral Given 09/09/19 0111)  iohexol (OMNIPAQUE) 350 MG/ML injection 75 mL (75 mLs Intravenous Contrast Given 09/09/19 0110)  acetaminophen (TYLENOL) tablet 1,000 mg (1,000 mg Oral Given 09/09/19 0229)    Pertinent labs & imaging results that were available during my care of the  patient were reviewed by me and  considered in my medical decision making (see chart for details).  Cynthia Knapp was evaluated in Emergency Department on 09/09/2019 for the symptoms described in the history of present illness. She was evaluated in the context of the global COVID-19 pandemic, which necessitated consideration that the patient might be at risk for infection with the SARS-CoV-2 virus that causes COVID-19. Institutional protocols and algorithms that pertain to the evaluation of patients at risk for COVID-19 are in a state of rapid change based on information released by regulatory bodies including the CDC and federal and state organizations. These policies and algorithms were followed during the patient's care in the ED.   Patient presents with neck pain in the setting of uncontrolled hypertension.  She is slightly bradycardic due to metoprolol.  Doubt meningitis encephalitis intracranial hypertension tumor or stroke.  No thunderclap headache, unlikely intracranial hemorrhage.  We will obtain CT angiogram head and neck.  Add amlodipine for blood pressure management.  Patient also had an episode of atypical chest pain earlier today.  EKG is nonischemic.  Troponins are normal.  No further cardiac work-up needed.   Clinical Course as of Sep 09 399  Fri Sep 09, 2019  0307 CT head discussed with radiologist.  Negative for arterial dissection or occlusion, but indeterminate for a possible small hemorrhage in the left basal ganglia.  They recommend a repeat noncontrast CT head at 4-hour interval for comparison.   [PS]  0307 Blood pressure well controlled after amlodipine   [PS]    Clinical Course User Index [PS] Carrie Mew, MD     ----------------------------------------- 4:01 AM on 09/09/2019 -----------------------------------------  Patient updated on results, awaiting 7 AM repeat CT head.  Will need to sign out to next shift to follow-up on CT scan result.  Would plan to  continue amlodipine and have the patient follow-up with her doctor in a week if repeat CT study is reassuring.  ____________________________________________   FINAL CLINICAL IMPRESSION(S) / ED DIAGNOSES    Final diagnoses:  Uncontrolled hypertension  Type 2 diabetes mellitus without complication, without long-term current use of insulin [facility])     ED Discharge Orders         Ordered    amLODipine (NORVASC) 10 MG tablet  Daily     09/09/19 0400          Portions of this note were generated with dragon dictation software. Dictation errors may occur despite best attempts at proofreading.   Carrie Mew, MD 09/09/19 [phone]

## 2019-09-09 NOTE — ED Provider Notes (Signed)
Repeat CT head:  CLINICAL DATA: Followup left basal ganglia density seen on earlier head CT.   EXAM:  CT HEAD WITHOUT CONTRAST  TECHNIQUE:  Contiguous axial images were obtained from the base of the skull through the vertex without intravenous contrast.   COMPARISON: CT scan, same date.   FINDINGS:  Small density in the medial left basal ganglia region is unchanged and most consistent with a tiny calcification. No findings for hemorrhage or lacunar infarction. No extra-axial fluid collections are identified.   IMPRESSION:  No acute intracranial findings. Left-sided basal ganglia calcification.   CT head as above, findings most consistent with calcification, no findings for hemorrhage or lacunar infarction.  Remainder of labs without actionable derangements.  Blood pressure improved with dose of antihypertensive given earlier.  As such, patient is stable for discharge as planned with Rx for amlodipine and close outpatient follow-up.  Given return precautions.   Lilia Pro., MD 09/09/19 [phone]

## 2019-09-13 ENCOUNTER — Ambulatory Visit: Payer: Medicare Other | Attending: Internal Medicine

## 2019-09-13 DIAGNOSIS — Z23 Encounter for immunization: Secondary | ICD-10-CM

## 2019-09-13 NOTE — Progress Notes (Signed)
   Covid-19 Vaccination Clinic  Name:  Cynthia Knapp    MRN: [id number] DOB: 11-17-1950  09/13/2019  [name] was observed post Covid-19 immunization for 15 minutes without incident. She was provided with Vaccine Information Sheet and instruction to access the V-Safe system.   [name] was instructed to call 911 with any severe reactions post vaccine: Marland Kitchen Difficulty breathing  . Swelling of face and throat  . A fast heartbeat  . A bad rash all over body  . Dizziness and weakness   Immunizations Administered    Name Date Dose VIS Date Route   Pfizer COVID-19 Vaccine 09/13/2019  9:32 AM 0.3 mL 06/10/2019 Intramuscular   Manufacturer: [employer]   Lot: [id number]   [place]: [id number]

## 2019-12-14 DIAGNOSIS — F419 Anxiety disorder, unspecified: Secondary | ICD-10-CM | POA: Diagnosis not present

## 2019-12-14 DIAGNOSIS — I10 Essential (primary) hypertension: Secondary | ICD-10-CM | POA: Diagnosis not present

## 2020-01-27 DIAGNOSIS — N898 Other specified noninflammatory disorders of vagina: Secondary | ICD-10-CM | POA: Diagnosis not present

## 2020-01-27 DIAGNOSIS — R300 Dysuria: Secondary | ICD-10-CM | POA: Diagnosis not present

## 2020-01-27 DIAGNOSIS — I10 Essential (primary) hypertension: Secondary | ICD-10-CM | POA: Diagnosis not present

## 2020-03-06 DIAGNOSIS — Z23 Encounter for immunization: Secondary | ICD-10-CM | POA: Diagnosis not present

## 2020-03-06 DIAGNOSIS — F419 Anxiety disorder, unspecified: Secondary | ICD-10-CM | POA: Diagnosis not present

## 2020-03-06 DIAGNOSIS — E1169 Type 2 diabetes mellitus with other specified complication: Secondary | ICD-10-CM | POA: Diagnosis not present

## 2020-03-06 DIAGNOSIS — Z6841 Body Mass Index (BMI) 40.0 and over, adult: Secondary | ICD-10-CM | POA: Diagnosis not present

## 2020-03-06 DIAGNOSIS — K219 Gastro-esophageal reflux disease without esophagitis: Secondary | ICD-10-CM | POA: Diagnosis not present

## 2020-03-06 DIAGNOSIS — I10 Essential (primary) hypertension: Secondary | ICD-10-CM | POA: Diagnosis not present

## 2020-03-06 DIAGNOSIS — Z0000 Encounter for general adult medical examination without abnormal findings: Secondary | ICD-10-CM | POA: Diagnosis not present

## 2020-03-06 DIAGNOSIS — E7800 Pure hypercholesterolemia, unspecified: Secondary | ICD-10-CM | POA: Diagnosis not present

## 2020-03-06 DIAGNOSIS — E559 Vitamin D deficiency, unspecified: Secondary | ICD-10-CM | POA: Diagnosis not present

## 2020-03-08 DIAGNOSIS — I10 Essential (primary) hypertension: Secondary | ICD-10-CM | POA: Diagnosis not present

## 2020-03-13 DIAGNOSIS — I10 Essential (primary) hypertension: Secondary | ICD-10-CM | POA: Diagnosis not present

## 2020-04-10 ENCOUNTER — Other Ambulatory Visit: Payer: Self-pay | Admitting: Obstetrics & Gynecology

## 2020-04-10 DIAGNOSIS — Z1231 Encounter for screening mammogram for malignant neoplasm of breast: Secondary | ICD-10-CM

## 2020-04-12 IMAGING — CT CT ANGIO HEAD
2 of 11 series · 7 of 33 positions shown · IV contrast (APPLIED)
Comparison: None.

CLINICAL DATA: Initial evaluation for acute headache, normal
neurologic exam.

EXAM:
CT ANGIOGRAPHY HEAD AND NECK
TECHNIQUE: Multidetector CT imaging of the head and neck was performed using
the standard protocol during bolus administration of intravenous
contrast. Multiplanar CT image reconstructions and MIPs were
obtained to evaluate the vascular anatomy. Carotid stenosis
measurements (when applicable) are obtained utilizing NASCET
criteria, using the distal internal carotid diameter as the
denominator.
CONTRAST:  75mL OMNIPAQUE IOHEXOL 350 MG/ML SOLN

[Series 13: cta head neck thins · axial · 0.33mm/px · z∈[-258,-48]mm · 5 of 629 slices shown]
[im 105/629  soft-tissue]
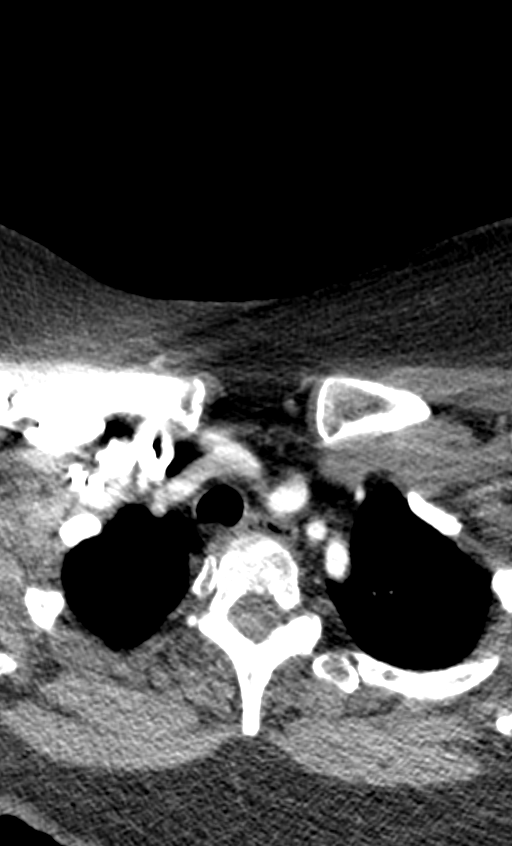
[im 210/629  bone]
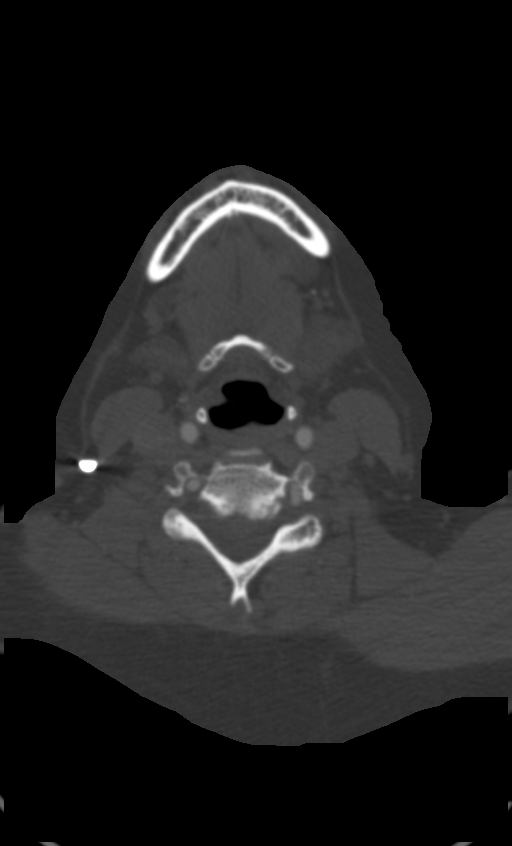
[im 315/629  soft-tissue]
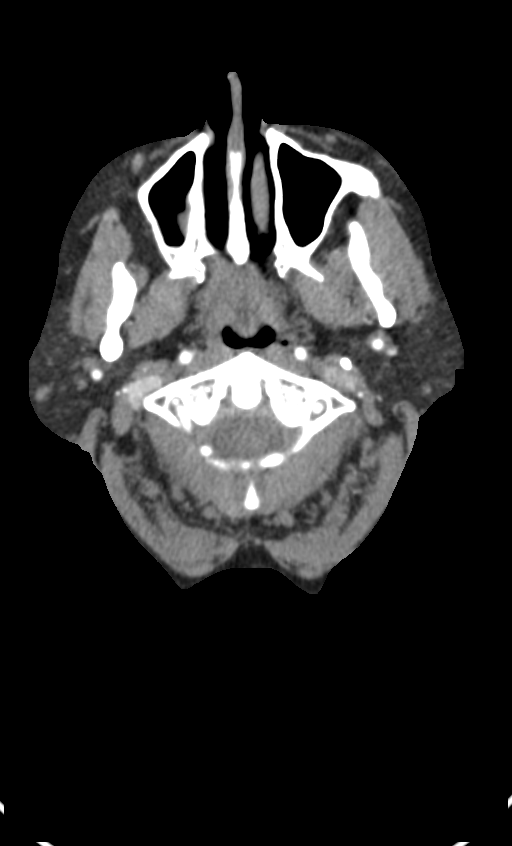
[im 419/629  bone]
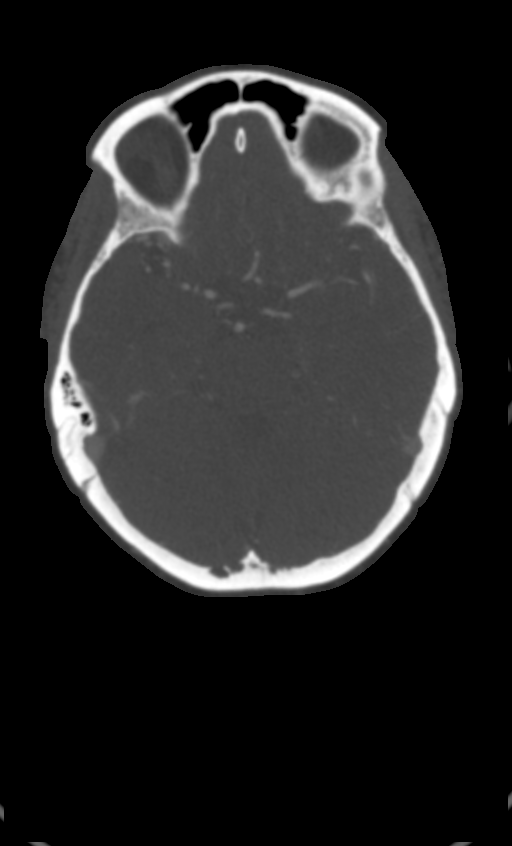
[im 524/629  soft-tissue]
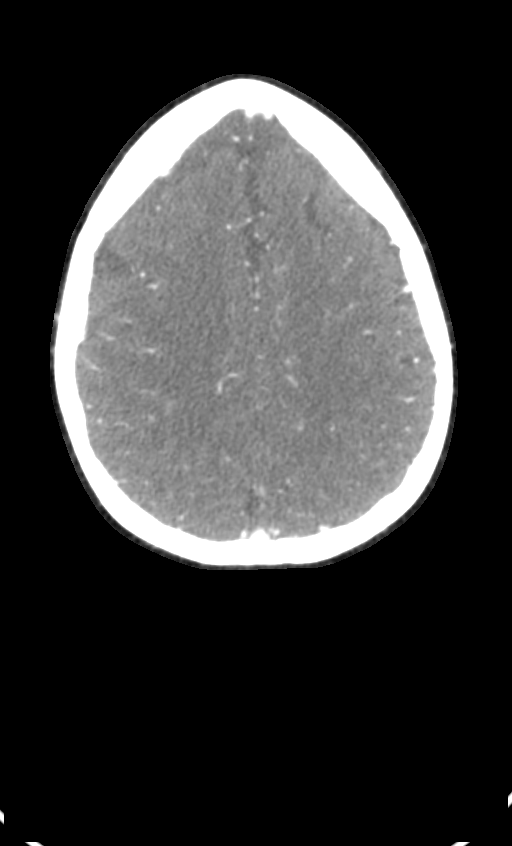

[Series 14: ax thin · axial · 0.30mm/px · z∈[-206,-103]mm · 2 of 309 slices shown]
[im 103/309  soft-tissue]
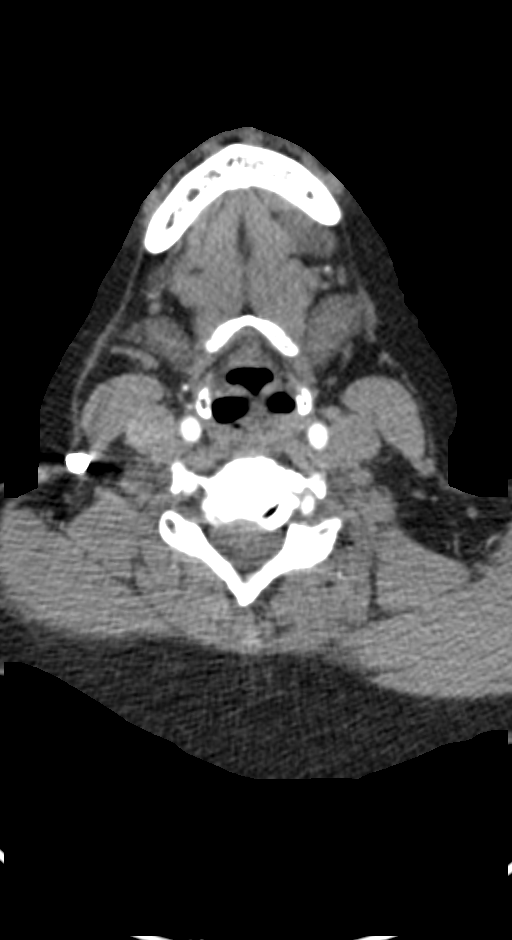
[im 206/309  soft-tissue]
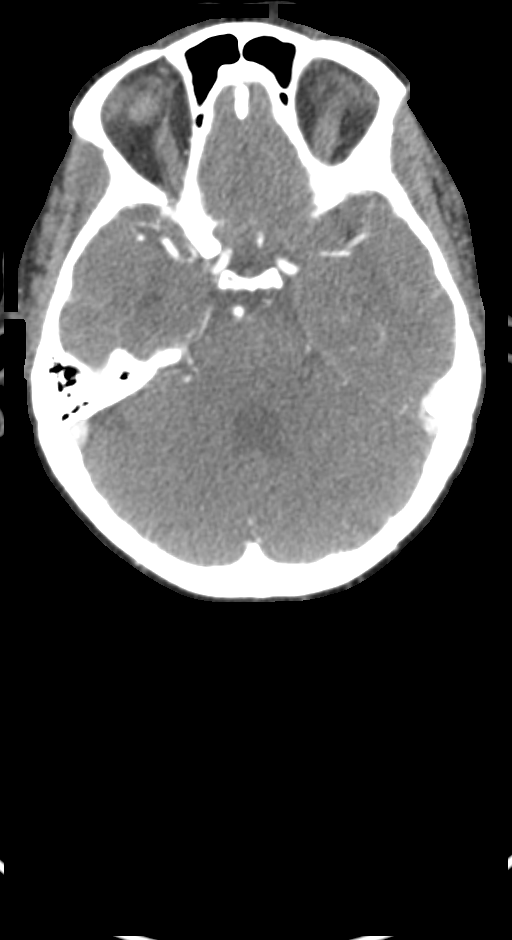

[7 of 33 positions shown; findings below may reference images not displayed]

FINDINGS: CT HEAD FINDINGS

Brain: Cerebral volume within normal limits for age. There is a
single 5 mm hyperdensity positioned at the left globus pallidus,
somewhat indeterminate, and could reflect focal
calcification/mineralization versus a tiny microhemorrhage (series
3, image 10). No other evidence for acute intracranial hemorrhage.
No acute large vessel territory infarct. No mass lesion, midline
shift or mass effect. No hydrocephalus. No extra-axial fluid
collection.

Vascular: No hyperdense vessel.

Skull: Scalp soft tissues within normal limits. Calvarium intact.

Sinuses: Paranasal sinuses and mastoid air cells are clear.

Orbits: Globes and orbital soft tissues within normal limits.

CTA NECK FINDINGS

Aortic arch: Visualized aortic arch of normal caliber with normal
branch pattern. Mild-to-moderate atheromatous change about the arch
and origin of the great vessels without hemodynamically significant
stenosis. Visualized subclavian arteries widely patent.

Right carotid system: Right common carotid artery widely patent from
its origin to the bifurcation without stenosis. Mild eccentric
plaque at the right bifurcation without hemodynamically significant
stenosis. Right ICA widely patent distally to the skull base without
stenosis, dissection or occlusion.

Left carotid system: Left common carotid artery widely patent from
its origin to the bifurcation. Mild scattered atheromatous plaque
about the left bifurcation without hemodynamically significant
stenosis. Left ICA widely patent distally to the skull base without
stenosis, dissection or occlusion.

Vertebral arteries: Both vertebral arteries arise from the
subclavian arteries. Vertebral arteries widely patent within the
neck without stenosis, dissection, or occlusion. Distal right V3
segment is duplicated.

Skeleton: No acute osseous abnormality. No discrete or worrisome
osseous lesions. Moderate cervical spondylosis noted at C5-6 and
C6-7.

Other neck: No other acute soft tissue abnormality within the neck.
No mass lesion or adenopathy.

Upper chest: Visualized upper chest demonstrates no acute finding.

Review of the MIP images confirms the above findings

CTA HEAD FINDINGS

Anterior circulation: Petrous segments widely patent. Scattered
atheromatous plaque within the cavernous/supraclinoid ICAs with
associated mild to moderate multifocal narrowing. ICA termini well
perfused. A1 segments patent bilaterally. Normal anterior
communicating artery. Anterior cerebral arteries widely patent to
their distal aspects without stenosis. No M1 stenosis or occlusion.
Normal MCA bifurcations. Distal MCA branches well perfused and
symmetric.

Posterior circulation: Right vertebral artery widely patent to the
vertebrobasilar junction without stenosis. Focal non stenotic plaque
noted within the proximal left V4 segment without significant
stenosis. Left vertebral otherwise widely patent. Both picas patent.
Basilar tortuous but widely patent to its distal aspect without
stenosis. Superior cerebral arteries patent bilaterally. Both PCAs
primarily supplied via the basilar well perfused or distal aspects.

Venous sinuses: Patent.

Anatomic variants: None significant. No aneurysm or other vascular
abnormality.

Review of the MIP images confirms the above findings
IMPRESSION: CT HEAD IMPRESSION:

1. 5 mm hyperdensity positioned at the left globus pallidus,
indeterminate. While this finding is favored to reflect a small
focus of mineralization/calcification, a small micro bleed is not
entirely excluded. Short interval follow-up CT in [DATE] hours
recommended for further evaluation.
2. Otherwise negative head CT. No other acute intracranial
abnormality.

CTA HEAD AND NECK IMPRESSION:

1. Negative CTA for large vessel occlusion, dissection, or other
acute vascular abnormality.
2. Mild-to-moderate atherosclerotic change about the aortic arch,
carotid bifurcations, and carotid siphons without significant
stenosis. No proximal high-grade or correctable stenosis identified.

Critical Value/emergent results were called by telephone at the time
of interpretation on 09/09/2019 at [DATE] to provider MCKINNEY
NAGII , who verbally acknowledged these results.

## 2020-04-12 IMAGING — CT CT ANGIO NECK
1 of 10 series · 6 of 33 positions shown · IV contrast (APPLIED)
Comparison: None.

CLINICAL DATA: Initial evaluation for acute headache, normal
neurologic exam.

EXAM:
CT ANGIOGRAPHY HEAD AND NECK
TECHNIQUE: Multidetector CT imaging of the head and neck was performed using
the standard protocol during bolus administration of intravenous
contrast. Multiplanar CT image reconstructions and MIPs were
obtained to evaluate the vascular anatomy. Carotid stenosis
measurements (when applicable) are obtained utilizing NASCET
criteria, using the distal internal carotid diameter as the
denominator.
CONTRAST:  75mL OMNIPAQUE IOHEXOL 350 MG/ML SOLN

[Series 14: ax thin · axial · 0.30mm/px · z∈[-264,-44]mm · 6 of 309 slices shown]
[im 45/309  soft-tissue]
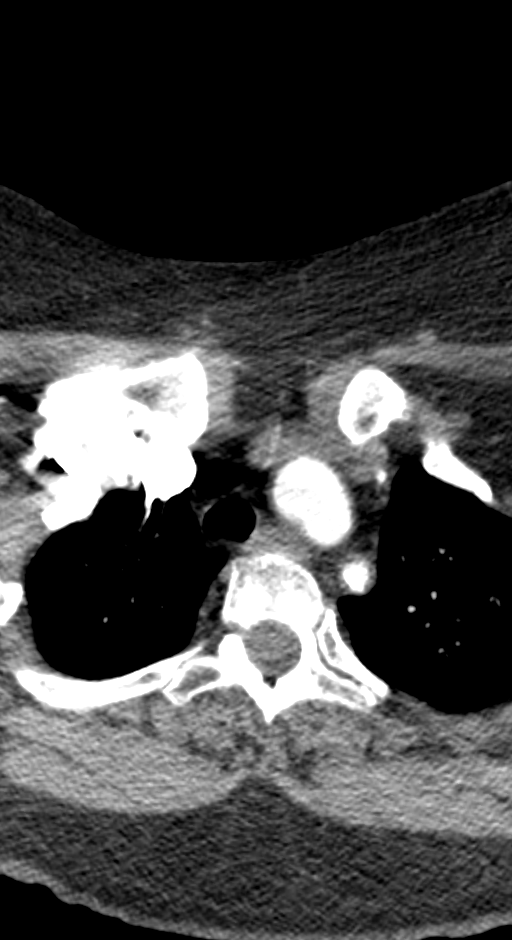
[im 89/309  bone]
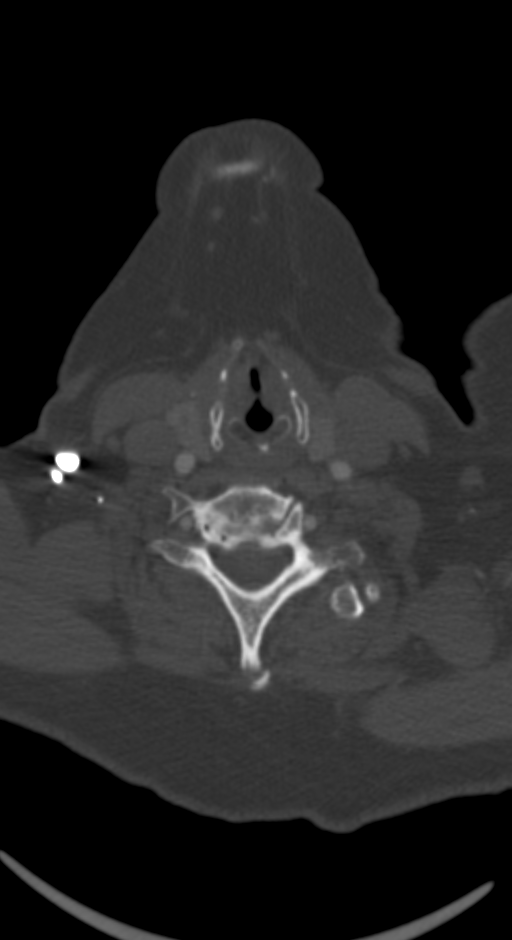
[im 133/309  soft-tissue]
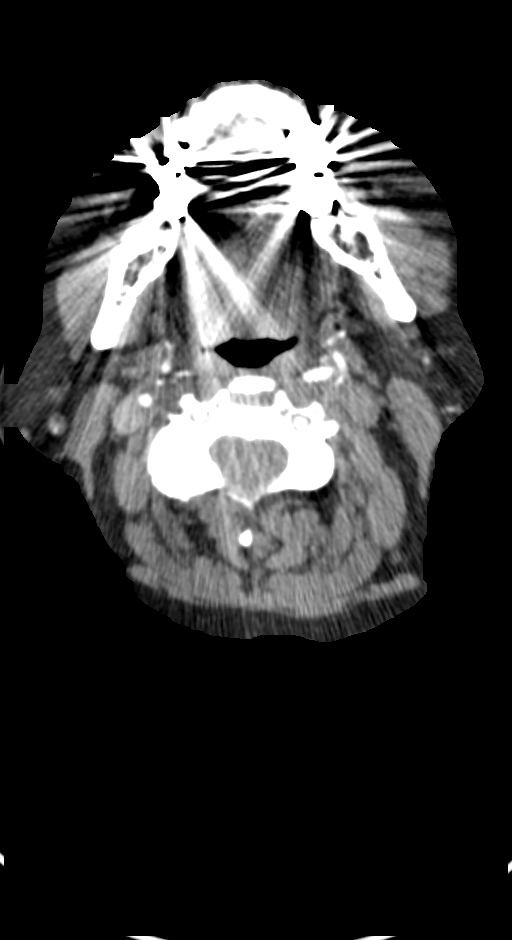
[im 177/309  bone]
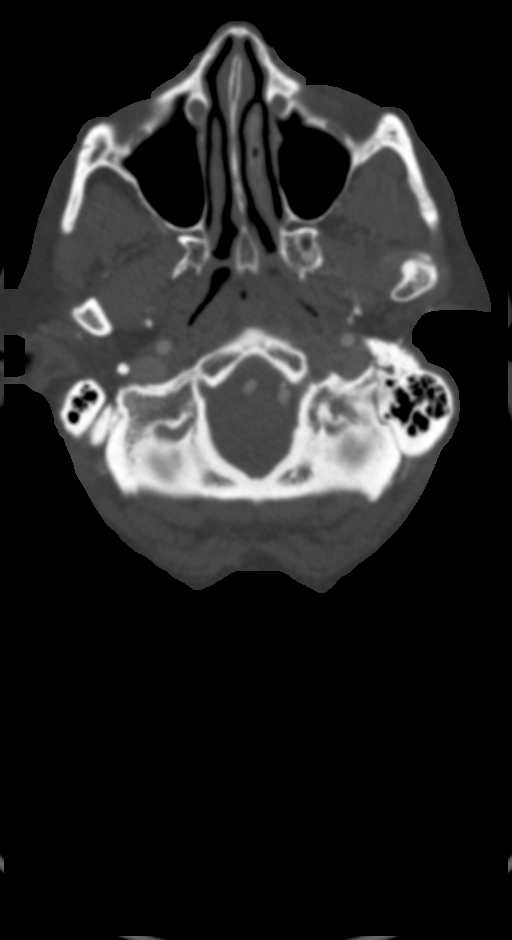
[im 221/309  soft-tissue]
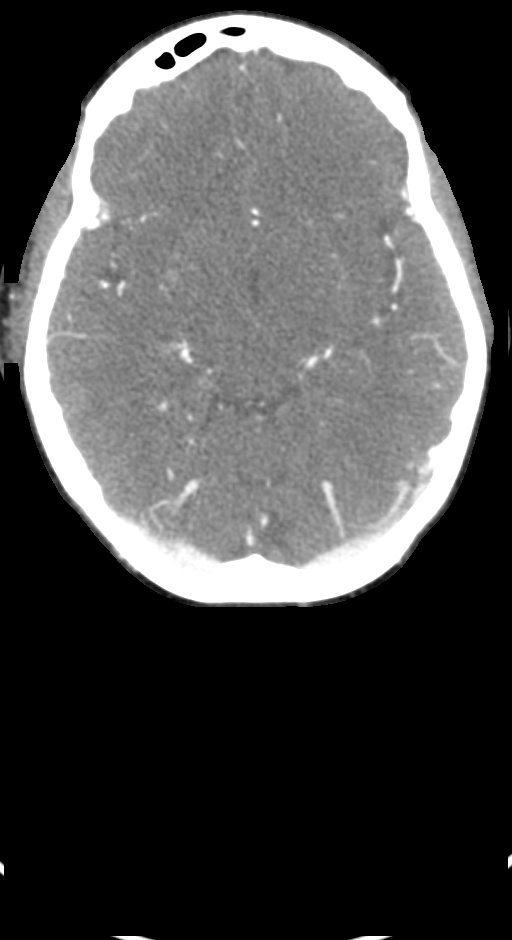
[im 265/309  bone]
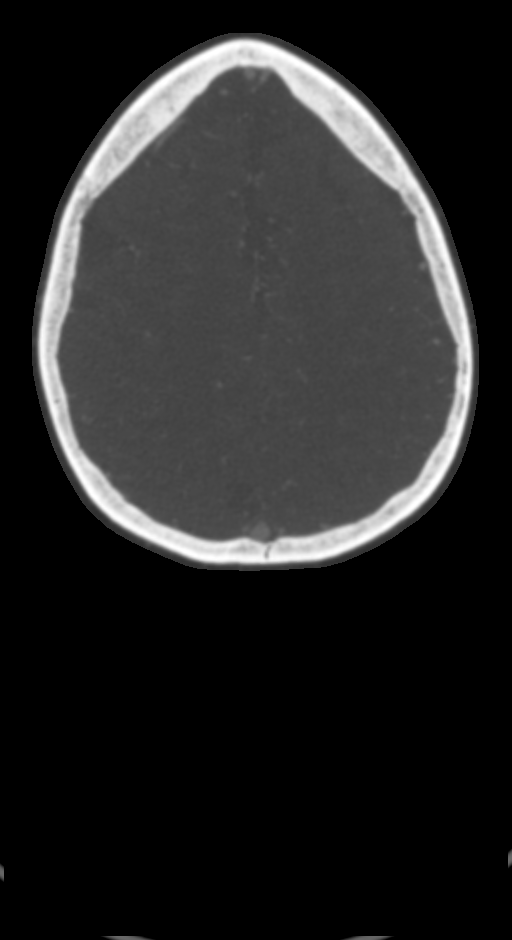

[6 of 33 positions shown; findings below may reference images not displayed]

FINDINGS: CT HEAD FINDINGS

Brain: Cerebral volume within normal limits for age. There is a
single 5 mm hyperdensity positioned at the left globus pallidus,
somewhat indeterminate, and could reflect focal
calcification/mineralization versus a tiny microhemorrhage (series
3, image 10). No other evidence for acute intracranial hemorrhage.
No acute large vessel territory infarct. No mass lesion, midline
shift or mass effect. No hydrocephalus. No extra-axial fluid
collection.

Vascular: No hyperdense vessel.

Skull: Scalp soft tissues within normal limits. Calvarium intact.

Sinuses: Paranasal sinuses and mastoid air cells are clear.

Orbits: Globes and orbital soft tissues within normal limits.

CTA NECK FINDINGS

Aortic arch: Visualized aortic arch of normal caliber with normal
branch pattern. Mild-to-moderate atheromatous change about the arch
and origin of the great vessels without hemodynamically significant
stenosis. Visualized subclavian arteries widely patent.

Right carotid system: Right common carotid artery widely patent from
its origin to the bifurcation without stenosis. Mild eccentric
plaque at the right bifurcation without hemodynamically significant
stenosis. Right ICA widely patent distally to the skull base without
stenosis, dissection or occlusion.

Left carotid system: Left common carotid artery widely patent from
its origin to the bifurcation. Mild scattered atheromatous plaque
about the left bifurcation without hemodynamically significant
stenosis. Left ICA widely patent distally to the skull base without
stenosis, dissection or occlusion.

Vertebral arteries: Both vertebral arteries arise from the
subclavian arteries. Vertebral arteries widely patent within the
neck without stenosis, dissection, or occlusion. Distal right V3
segment is duplicated.

Skeleton: No acute osseous abnormality. No discrete or worrisome
osseous lesions. Moderate cervical spondylosis noted at C5-6 and
C6-7.

Other neck: No other acute soft tissue abnormality within the neck.
No mass lesion or adenopathy.

Upper chest: Visualized upper chest demonstrates no acute finding.

Review of the MIP images confirms the above findings

CTA HEAD FINDINGS

Anterior circulation: Petrous segments widely patent. Scattered
atheromatous plaque within the cavernous/supraclinoid ICAs with
associated mild to moderate multifocal narrowing. ICA termini well
perfused. A1 segments patent bilaterally. Normal anterior
communicating artery. Anterior cerebral arteries widely patent to
their distal aspects without stenosis. No M1 stenosis or occlusion.
Normal MCA bifurcations. Distal MCA branches well perfused and
symmetric.

Posterior circulation: Right vertebral artery widely patent to the
vertebrobasilar junction without stenosis. Focal non stenotic plaque
noted within the proximal left V4 segment without significant
stenosis. Left vertebral otherwise widely patent. Both picas patent.
Basilar tortuous but widely patent to its distal aspect without
stenosis. Superior cerebral arteries patent bilaterally. Both PCAs
primarily supplied via the basilar well perfused or distal aspects.

Venous sinuses: Patent.

Anatomic variants: None significant. No aneurysm or other vascular
abnormality.

Review of the MIP images confirms the above findings
IMPRESSION: CT HEAD IMPRESSION:

1. 5 mm hyperdensity positioned at the left globus pallidus,
indeterminate. While this finding is favored to reflect a small
focus of mineralization/calcification, a small micro bleed is not
entirely excluded. Short interval follow-up CT in [DATE] hours
recommended for further evaluation.
2. Otherwise negative head CT. No other acute intracranial
abnormality.

CTA HEAD AND NECK IMPRESSION:

1. Negative CTA for large vessel occlusion, dissection, or other
acute vascular abnormality.
2. Mild-to-moderate atherosclerotic change about the aortic arch,
carotid bifurcations, and carotid siphons without significant
stenosis. No proximal high-grade or correctable stenosis identified.

Critical Value/emergent results were called by telephone at the time
of interpretation on 09/09/2019 at [DATE] to provider MCKINNEY
NAGII , who verbally acknowledged these results.

## 2020-04-20 DIAGNOSIS — Z6832 Body mass index (BMI) 32.0-32.9, adult: Secondary | ICD-10-CM | POA: Diagnosis not present

## 2020-04-20 DIAGNOSIS — F419 Anxiety disorder, unspecified: Secondary | ICD-10-CM | POA: Diagnosis not present

## 2020-04-20 DIAGNOSIS — I10 Essential (primary) hypertension: Secondary | ICD-10-CM | POA: Diagnosis not present

## 2020-04-27 ENCOUNTER — Other Ambulatory Visit: Payer: Self-pay

## 2020-04-27 ENCOUNTER — Ambulatory Visit
Admission: RE | Admit: 2020-04-27 | Discharge: 2020-04-27 | Disposition: A | Discharge status: Home / Self Care | Payer: Medicare Other | Source: Ambulatory Visit | Attending: Obstetrics & Gynecology | Admitting: Obstetrics & Gynecology

## 2020-04-27 DIAGNOSIS — Z1231 Encounter for screening mammogram for malignant neoplasm of breast: Secondary | ICD-10-CM | POA: Diagnosis not present

## 2020-06-16 DIAGNOSIS — Z23 Encounter for immunization: Secondary | ICD-10-CM | POA: Diagnosis not present

## 2020-07-23 DIAGNOSIS — T464X5A Adverse effect of angiotensin-converting-enzyme inhibitors, initial encounter: Secondary | ICD-10-CM | POA: Diagnosis not present

## 2020-07-23 DIAGNOSIS — R058 Other specified cough: Secondary | ICD-10-CM | POA: Diagnosis not present

## 2020-07-23 DIAGNOSIS — I10 Essential (primary) hypertension: Secondary | ICD-10-CM | POA: Diagnosis not present

## 2021-02-04 DIAGNOSIS — M542 Cervicalgia: Secondary | ICD-10-CM | POA: Diagnosis not present

## 2021-02-04 DIAGNOSIS — R079 Chest pain, unspecified: Secondary | ICD-10-CM | POA: Diagnosis not present

## 2021-02-04 DIAGNOSIS — Z1329 Encounter for screening for other suspected endocrine disorder: Secondary | ICD-10-CM | POA: Diagnosis not present

## 2021-02-04 DIAGNOSIS — Z6839 Body mass index (BMI) 39.0-39.9, adult: Secondary | ICD-10-CM | POA: Diagnosis not present

## 2021-02-08 ENCOUNTER — Other Ambulatory Visit: Payer: Self-pay

## 2021-02-08 ENCOUNTER — Ambulatory Visit: Payer: Medicare Other | Admitting: Cardiology

## 2021-02-08 ENCOUNTER — Encounter: Payer: Self-pay | Admitting: Cardiology

## 2021-02-08 VITALS — BP 110/77 | HR 58 | Temp 97.6°F | Resp 16 | Ht 63.0 in | Wt 215.0 lb

## 2021-02-08 DIAGNOSIS — R9431 Abnormal electrocardiogram [ECG] [EKG]: Secondary | ICD-10-CM

## 2021-02-08 DIAGNOSIS — E119 Type 2 diabetes mellitus without complications: Secondary | ICD-10-CM | POA: Diagnosis not present

## 2021-02-08 DIAGNOSIS — I209 Angina pectoris, unspecified: Secondary | ICD-10-CM | POA: Diagnosis not present

## 2021-02-08 DIAGNOSIS — E782 Mixed hyperlipidemia: Secondary | ICD-10-CM | POA: Diagnosis not present

## 2021-02-08 DIAGNOSIS — R072 Precordial pain: Secondary | ICD-10-CM | POA: Diagnosis not present

## 2021-02-08 DIAGNOSIS — K219 Gastro-esophageal reflux disease without esophagitis: Secondary | ICD-10-CM

## 2021-02-08 DIAGNOSIS — I10 Essential (primary) hypertension: Secondary | ICD-10-CM

## 2021-02-08 MED ORDER — ISOSORBIDE DINITRATE 30 MG PO TABS: 30.0000 mg | ORAL_TABLET | Freq: Two times a day (BID) | ORAL | 2 refills | Status: DC

## 2021-02-08 MED ORDER — EZETIMIBE 10 MG PO TABS: 10.0000 mg | ORAL_TABLET | Freq: Every day | ORAL | 3 refills | Status: AC

## 2021-02-08 MED ORDER — FAMOTIDINE 40 MG PO TABS: 40.0000 mg | ORAL_TABLET | Freq: Every day | ORAL | 1 refills | Status: DC

## 2021-02-08 MED ORDER — NITROGLYCERIN 0.4 MG SL SUBL: 0.4000 mg | SUBLINGUAL_TABLET | SUBLINGUAL | 3 refills | Status: AC | PRN

## 2021-02-08 MED ORDER — ASPIRIN 81 MG PO CHEW: 81.0000 mg | CHEWABLE_TABLET | Freq: Every day | ORAL | 3 refills | Status: DC

## 2021-02-08 NOTE — Progress Notes (Signed)
Primary Physician/Referring:  Kathyrn Lass, MD  Patient ID: Cynthia Knapp, female    DOB: 11-19-1950, 70 y.o.   MRN: [id number]  Chief Complaint  Patient presents with   Chest Pain    Left side   Jaw Pain    Left side   Neck Pain    Left side   New Patient (Initial Visit)   HPI:    Cynthia Knapp  is a 70 y.o. Caucasian female patient with hypertension, hyperlipidemia, uncontrolled diabetes mellitus, morbid obesity history of premature coronary artery disease in both her parents at age <55 years referred to me for evaluation of chest pain and dyspnea on exertion.  Symptoms started about 5 to 6 months ago but have progressively gotten worse, described as exertional tightness in the neck that radiates to precordium.  It is associated with dyspnea on exertion which is also getting worse, no PND or orthopnea.  No rest pain.  Past Medical History:  Diagnosis Date   Hypertension    Past Surgical History:  Procedure Laterality Date   REPLACEMENT TOTAL KNEE Bilateral 2008   Family History  Problem Relation Age of Onset   Heart disease Mother 38       CAD. Died at age 34   Heart failure Mother    Heart disease Father 57       MI and diet at age 16Y   Heart disease Brother        COPD and died at age 67    Social History   Tobacco Use   Smoking status: Never   Smokeless tobacco: Never  Substance Use Topics   Alcohol use: Never   Marital Status: Married  ROS  Review of Systems  Cardiovascular:  Positive for chest pain and dyspnea on exertion. Negative for leg swelling.  Gastrointestinal:  Negative for melena.  Objective  Blood pressure 110/77, pulse (!) 58, temperature 97.6 F (36.4 C), resp. rate 16, height '5\' 3"'$  (1.6 m), weight 215 lb (97.5 kg), SpO2 94 %. Body mass index is 38.09 kg/m.  Vitals with BMI 02/08/2021 09/09/2019 09/09/2019  Height '5\' 3"'$  - -  Weight 215 lbs - -  BMI [id number] - -  Systolic [id number]  Diastolic 77 80 92  Pulse 58 72 58     Physical  Exam Constitutional:      Comments: Morbidly obese in no acute distress.  Neck:     Vascular: No carotid bruit or JVD.  Cardiovascular:     Rate and Rhythm: Normal rate and regular rhythm.     Pulses: Normal pulses and intact distal pulses.     Heart sounds: Normal heart sounds. No murmur heard.   No gallop.  Pulmonary:     Effort: Pulmonary effort is normal.     Breath sounds: Normal breath sounds.  Abdominal:     General: Bowel sounds are normal.     Palpations: Abdomen is soft.     Comments: Obese. Pannus present  Musculoskeletal:        General: No swelling.     Laboratory examination:   No results for input(s): NA, K, CL, CO2, GLUCOSE, BUN, CREATININE, CALCIUM, GFRNONAA, GFRAA in the last 8760 hours. CrCl cannot be calculated (Patient's most recent lab result is older than the maximum 21 days allowed.).  CMP Latest Ref Rng & Units 09/08/2019 01/18/2011 01/17/2011  Glucose 70 - 99 mg/dL 131(H) 106(H) 132(H)  BUN 8 - 23 mg/dL '19 10 10  '$ Creatinine 0.44 - 1.00  mg/dL 0.79 0.65 0.67  Sodium 135 - 145 mmol/L 139 142 140  Potassium 3.5 - 5.1 mmol/L 4.8 3.7 4.4  Chloride 98 - 111 mmol/L 103 109 106  CO2 22 - 32 mmol/L '26 28 29  '$ Calcium 8.9 - 10.3 mg/dL 9.9 8.3(L) 8.7  Total Protein 6.5 - 8.1 g/dL 7.9 - -  Total Bilirubin 0.3 - 1.2 mg/dL 0.6 - -  Alkaline Phos 38 - 126 U/L 77 - -  AST 15 - 41 U/L 28 - -  ALT 0 - 44 U/L 36 - -   CBC Latest Ref Rng & Units 09/08/2019 01/18/2011 01/17/2011  WBC 4.0 - 10.5 K/uL 9.8 9.1 9.6  Hemoglobin 12.0 - 15.0 g/dL 14.2 9.5(L) 11.0(L)  Hematocrit 36.0 - 46.0 % 43.7 28.5(L) 32.7(L)  Platelets 150 - 400 K/uL 289 189 225    Lipid Panel No results for input(s): CHOL, TRIG, LDLCALC, VLDL, HDL, CHOLHDL, LDLDIRECT in the last 8760 hours. Lipid Panel  No results found for: CHOL, TRIG, HDL, CHOLHDL, VLDL, LDLCALC, LDLDIRECT, LABVLDL   HEMOGLOBIN A1C No results found for: HGBA1C, MPG TSH No results for input(s): TSH in the last 8760  hours.  External labs:   Cholesterol, total 178.000 m 02/04/2021 HDL 42.000 mg 02/04/2021 LDL 93.000 mg 02/04/2021 Triglycerides 253.000 m 02/04/2021  A1C 6.500 % 03/06/2020 TSH 1.860 02/04/2021  Hemoglobin 13.500 g/d 02/04/2021 Platelets 289.000 K/ 09/08/2019  Creatinine, Serum 0.920 mg/ 02/04/2021 Potassium 4.700 mm 02/04/2021 Magnesium 2.400 MG/ 12/18/2015 ALT (SGPT) 23.000 U/L 02/04/2021  Medications and allergies   Allergies  Allergen Reactions   Amlodipine Other (See Comments)    Medication prior to this encounter:   Outpatient Medications Prior to Visit  Medication Sig Dispense Refill   acetaminophen (TYLENOL) 325 MG tablet Take 650 mg by mouth every 6 (six) hours as needed.     atorvastatin (LIPITOR) 20 MG tablet Take 20 mg by mouth daily.     FLUoxetine (PROZAC) 20 MG capsule Take 20 mg by mouth daily.     hydrochlorothiazide (HYDRODIURIL) 25 MG tablet Take 25 mg by mouth daily.     metFORMIN (GLUCOPHAGE) 500 MG tablet Take by mouth 2 (two) times daily with a meal.     metoprolol succinate (TOPROL-XL) 100 MG 24 hr tablet Take 100 mg by mouth daily. Take with or immediately following a meal.     pantoprazole (PROTONIX) 40 MG tablet Take 40 mg by mouth daily.     valsartan (DIOVAN) 160 MG tablet Take 160 mg by mouth daily.     Vitamin D, Ergocalciferol, (DRISDOL) 1.25 MG (50000 UT) CAPS capsule Take 50,000 Units by mouth every 7 (seven) days.     amLODipine (NORVASC) 10 MG tablet Take 1 tablet (10 mg total) by mouth daily. 30 tablet 0   omeprazole (PRILOSEC) 10 MG capsule Take 10 mg by mouth daily.     No facility-administered medications prior to visit.     Medication list after today's encounter   Current Outpatient Medications  Medication Instructions   acetaminophen (TYLENOL) 650 mg, Oral, Every 6 hours PRN   aspirin (ASPIRIN CHILDRENS) 81 mg, Oral, Daily   atorvastatin (LIPITOR) 20 mg, Oral, Daily   ezetimibe (ZETIA) 10 mg, Oral, Daily after supper   famotidine (PEPCID) 40  mg, Oral, Daily   FLUoxetine (PROZAC) 20 mg, Oral, Daily   hydrochlorothiazide (HYDRODIURIL) 25 mg, Oral, Daily   isosorbide dinitrate (ISORDIL) 30 mg, Oral, 2 times daily   metFORMIN (GLUCOPHAGE) 500 MG tablet Oral, 2  times daily with meals   metoprolol succinate (TOPROL-XL) 100 mg, Oral, Daily, Take with or immediately following a meal.    nitroGLYCERIN (NITROSTAT) 0.4 mg, Sublingual, Every 5 min PRN   pantoprazole (PROTONIX) 40 mg, Oral, Daily   valsartan (DIOVAN) 160 mg, Oral, Daily   Vitamin D (Ergocalciferol) (DRISDOL) 50,000 Units, Oral, Every 7 days   Radiology:   No results found.  Cardiac Studies:   None  EKG:   EKG 02/08/2021: Normal sinus rhythm at rate of 86 bpm, left atrial enlargement, left axis deviation, left anterior fascicular block.  Incomplete right bundle branch block.  Poor R wave progression, cannot exclude anterolateral infarct old.    Assessment     ICD-10-CM   1. Angina pectoris (HCC)  I20.9 EKG 12-Lead    PCV ECHOCARDIOGRAM COMPLETE    isosorbide dinitrate (ISORDIL) 30 MG tablet    PCV MYOCARDIAL PERFUSION WO LEXISCAN    aspirin (ASPIRIN CHILDRENS) 81 MG chewable tablet    nitroGLYCERIN (NITROSTAT) 0.4 MG SL tablet    2. Nonspecific abnormal electrocardiogram (ECG) (EKG)  R94.31     3. Primary hypertension  I10     4. Type 2 diabetes mellitus without complication, without long-term current use of insulin (HCC)  E11.9     5. Mixed hyperlipidemia  E78.2 ezetimibe (ZETIA) 10 MG tablet    6. Gastroesophageal reflux disease without esophagitis  K21.9 famotidine (PEPCID) 40 MG tablet       Medications Discontinued During This Encounter  Medication Reason   omeprazole (PRILOSEC) 10 MG capsule Error   amLODipine (NORVASC) 10 MG tablet Error    Meds ordered this encounter  Medications   isosorbide dinitrate (ISORDIL) 30 MG tablet    Sig: Take 1 tablet (30 mg total) by mouth 2 (two) times daily.    Dispense:  60 tablet    Refill:  2   aspirin  (ASPIRIN CHILDRENS) 81 MG chewable tablet    Sig: Chew 1 tablet (81 mg total) by mouth daily.    Dispense:  90 tablet    Refill:  3   nitroGLYCERIN (NITROSTAT) 0.4 MG SL tablet    Sig: Place 1 tablet (0.4 mg total) under the tongue every 5 (five) minutes as needed for up to 25 days for chest pain.    Dispense:  25 tablet    Refill:  3   famotidine (PEPCID) 40 MG tablet    Sig: Take 1 tablet (40 mg total) by mouth daily.    Dispense:  30 tablet    Refill:  1   ezetimibe (ZETIA) 10 MG tablet    Sig: Take 1 tablet (10 mg total) by mouth daily after supper.    Dispense:  90 tablet    Refill:  3    Orders Placed This Encounter  Procedures   PCV MYOCARDIAL PERFUSION WO LEXISCAN    Standing Status:   Future    Standing Expiration Date:   04/10/2021   EKG 12-Lead   PCV ECHOCARDIOGRAM COMPLETE    Standing Status:   Future    Standing Expiration Date:   02/08/2022   Recommendations:   Anashia Rill is a 70 y.o. Caucasian female patient with hypertension, hyperlipidemia, uncontrolled diabetes mellitus, morbid obesity history of premature coronary artery disease in both her parents at age <55 years referred to me for evaluation of chest pain and dyspnea on exertion.    Symptoms started about 5 to 6 months ago but have progressively gotten worse, described as exertional tightness  in the neck that radiates to precordium.  It is associated with dyspnea on exertion which is also getting worse, no PND or orthopnea.  No rest pain.  Her symptoms of chest pain are very classic for angina pectoris.  Beta-blocker, could not tolerate amlodipine due to leg edema.  I will start her on isosorbide dinitrate 30 mg p.o. twice daily.  Aspirin 81 mg daily.  Sublingual nitroglycerin prescribed.  Indications for returning to our office urgently or to the emergency room discussed in detail.  Lipids are not well controlled.  Purely related to her poor diet and dietary restrictions were discussed.  Added Zetia 10 mg  in the evening. Schedule for a Exercise Nuclear stress test to evaluate for myocardial ischemia. Will schedule for an echocardiogram.  She has significant GERD as well, she is currently on pantoprazole, added Pepcid to this to see if her symptoms of GERD would improve.  This is different from her anginal symptoms.  OV in 4 weeks.    Adrian Prows, MD, [facility] 02/09/2021, 10:59 AM Office: [phone]

## 2021-03-03 ENCOUNTER — Other Ambulatory Visit: Payer: Self-pay | Admitting: Cardiology

## 2021-03-03 DIAGNOSIS — K219 Gastro-esophageal reflux disease without esophagitis: Secondary | ICD-10-CM

## 2021-03-06 ENCOUNTER — Other Ambulatory Visit: Payer: Self-pay

## 2021-03-06 ENCOUNTER — Ambulatory Visit: Payer: Medicare Other

## 2021-03-06 DIAGNOSIS — I209 Angina pectoris, unspecified: Secondary | ICD-10-CM | POA: Diagnosis not present

## 2021-03-13 ENCOUNTER — Other Ambulatory Visit: Payer: Self-pay

## 2021-03-13 ENCOUNTER — Ambulatory Visit: Payer: Medicare Other

## 2021-03-13 DIAGNOSIS — I209 Angina pectoris, unspecified: Secondary | ICD-10-CM | POA: Diagnosis not present

## 2021-03-18 ENCOUNTER — Encounter: Payer: Self-pay | Admitting: Cardiology

## 2021-03-18 ENCOUNTER — Other Ambulatory Visit: Payer: Self-pay

## 2021-03-18 ENCOUNTER — Ambulatory Visit: Payer: Medicare Other | Admitting: Cardiology

## 2021-03-18 VITALS — BP 106/70 | HR 68 | Temp 97.6°F | Resp 16 | Ht 63.0 in | Wt 211.6 lb

## 2021-03-18 DIAGNOSIS — I209 Angina pectoris, unspecified: Secondary | ICD-10-CM

## 2021-03-18 DIAGNOSIS — R9431 Abnormal electrocardiogram [ECG] [EKG]: Secondary | ICD-10-CM

## 2021-03-18 DIAGNOSIS — I10 Essential (primary) hypertension: Secondary | ICD-10-CM | POA: Diagnosis not present

## 2021-03-18 DIAGNOSIS — K219 Gastro-esophageal reflux disease without esophagitis: Secondary | ICD-10-CM

## 2021-03-18 NOTE — Progress Notes (Signed)
Primary Physician/Referring:  Kathyrn Lass, MD  Patient ID: Cynthia Knapp, female    DOB: September 18, 1950, 70 y.o.   MRN: [id number]  Chief Complaint  Patient presents with   Chest Pain   Follow-up    3 weeks   HPI:    Cynthia Knapp  is a 70 y.o. Caucasian female patient with hypertension, hyperlipidemia, uncontrolled diabetes mellitus, morbid obesity history of premature coronary artery disease in both her parents at age <55 years referred to me for evaluation of chest pain and dyspnea on exertion.  Symptoms started about 5 to 6 months ago but have progressively gotten worse, described as exertional tightness in the neck that radiates to precordium.  It is associated with dyspnea on exertion which is also getting worse, no PND or orthopnea.  No rest pain.  I had seen her about 4 - 5 weeks ago and started her on isosorbide mononitrate, continued high-dose beta-blocker therapy.  States that with the medical therapy her symptoms of angina and dyspnea slightly improved.  She is accompanied by her husband.  Past Medical History:  Diagnosis Date   Hypertension    Past Surgical History:  Procedure Laterality Date   REPLACEMENT TOTAL KNEE Bilateral 2008   Family History  Problem Relation Age of Onset   Heart disease Mother 62       CAD. Died at age 95   Heart failure Mother    Heart disease Father 95       MI and diet at age 24Y   Heart disease Brother        COPD and died at age 36    Social History   Tobacco Use   Smoking status: Never   Smokeless tobacco: Never  Substance Use Topics   Alcohol use: Never   Marital Status: Married  ROS  Review of Systems  Cardiovascular:  Positive for chest pain and dyspnea on exertion. Negative for leg swelling.  Gastrointestinal:  Negative for melena.  Objective  Blood pressure 106/70, pulse 68, temperature 97.6 F (36.4 C), temperature source Temporal, resp. rate 16, height '5\' 3"'$  (1.6 m), weight 211 lb 9.6 oz (96 kg), SpO2 98 %. Body mass  index is 37.48 kg/m.  Vitals with BMI 03/18/2021 02/08/2021 09/09/2019  Height '5\' 3"'$  '5\' 3"'$  -  Weight 211 lbs 10 oz 215 lbs -  BMI [id number] -  Systolic [id number]  Diastolic 70 77 80  Pulse 68 58 72     Physical Exam Constitutional:      Comments: Morbidly obese in no acute distress.  Neck:     Vascular: No carotid bruit or JVD.  Cardiovascular:     Rate and Rhythm: Normal rate and regular rhythm.     Pulses: Normal pulses and intact distal pulses.     Heart sounds: Normal heart sounds. No murmur heard.   No gallop.  Pulmonary:     Effort: Pulmonary effort is normal.     Breath sounds: Normal breath sounds.  Abdominal:     General: Bowel sounds are normal.     Palpations: Abdomen is soft.     Comments: Obese. Pannus present  Musculoskeletal:        General: No swelling.     Laboratory examination:   No results for input(s): NA, K, CL, CO2, GLUCOSE, BUN, CREATININE, CALCIUM, GFRNONAA, GFRAA in the last 8760 hours. CrCl cannot be calculated (Patient's most recent lab result is older than the maximum 21 days allowed.).  CMP Latest  Ref Rng & Units 09/08/2019 01/18/2011 01/17/2011  Glucose 70 - 99 mg/dL 131(H) 106(H) 132(H)  BUN 8 - 23 mg/dL '19 10 10  '$ Creatinine 0.44 - 1.00 mg/dL 0.79 0.65 0.67  Sodium 135 - 145 mmol/L 139 142 140  Potassium 3.5 - 5.1 mmol/L 4.8 3.7 4.4  Chloride 98 - 111 mmol/L 103 109 106  CO2 22 - 32 mmol/L '26 28 29  '$ Calcium 8.9 - 10.3 mg/dL 9.9 8.3(L) 8.7  Total Protein 6.5 - 8.1 g/dL 7.9 - -  Total Bilirubin 0.3 - 1.2 mg/dL 0.6 - -  Alkaline Phos 38 - 126 U/L 77 - -  AST 15 - 41 U/L 28 - -  ALT 0 - 44 U/L 36 - -   CBC Latest Ref Rng & Units 09/08/2019 01/18/2011 01/17/2011  WBC 4.0 - 10.5 K/uL 9.8 9.1 9.6  Hemoglobin 12.0 - 15.0 g/dL 14.2 9.5(L) 11.0(L)  Hematocrit 36.0 - 46.0 % 43.7 28.5(L) 32.7(L)  Platelets 150 - 400 K/uL 289 189 225    Lipid Panel No results for input(s): CHOL, TRIG, LDLCALC, VLDL, HDL, CHOLHDL, LDLDIRECT in the last 8760  hours. Lipid Panel  No results found for: CHOL, TRIG, HDL, CHOLHDL, VLDL, LDLCALC, LDLDIRECT, LABVLDL   HEMOGLOBIN A1C No results found for: HGBA1C, MPG TSH No results for input(s): TSH in the last 8760 hours.  External labs:   Cholesterol, total 178.000 m 02/04/2021 HDL 42.000 mg 02/04/2021 LDL 93.000 mg 02/04/2021 Triglycerides 253.000 m 02/04/2021  A1C 6.500 % 03/06/2020 TSH 1.860 02/04/2021  Hemoglobin 13.500 g/d 02/04/2021 Platelets 289.000 K/ 09/08/2019  Creatinine, Serum 0.920 mg/ 02/04/2021 Potassium 4.700 mm 02/04/2021 Magnesium 2.400 MG/ 12/18/2015 ALT (SGPT) 23.000 U/L 02/04/2021  Medications and allergies   Allergies  Allergen Reactions   Amlodipine Other (See Comments)    Medication prior to this encounter:   Outpatient Medications Prior to Visit  Medication Sig Dispense Refill   acetaminophen (TYLENOL) 325 MG tablet Take 650 mg by mouth every 6 (six) hours as needed.     aspirin (ASPIRIN CHILDRENS) 81 MG chewable tablet Chew 1 tablet (81 mg total) by mouth daily. 90 tablet 3   atorvastatin (LIPITOR) 20 MG tablet Take 20 mg by mouth daily.     ezetimibe (ZETIA) 10 MG tablet Take 1 tablet (10 mg total) by mouth daily after supper. 90 tablet 3   famotidine (PEPCID) 40 MG tablet TAKE 1 TABLET BY MOUTH EVERY DAY 90 tablet 1   FLUoxetine (PROZAC) 20 MG capsule Take 20 mg by mouth daily.     hydrochlorothiazide (HYDRODIURIL) 25 MG tablet Take 25 mg by mouth daily.     isosorbide dinitrate (ISORDIL) 30 MG tablet Take 1 tablet (30 mg total) by mouth 2 (two) times daily. 60 tablet 2   metFORMIN (GLUCOPHAGE) 500 MG tablet Take by mouth 2 (two) times daily with a meal.     metoprolol succinate (TOPROL-XL) 100 MG 24 hr tablet Take 100 mg by mouth daily. Take with or immediately following a meal.     nitroGLYCERIN (NITROSTAT) 0.4 MG SL tablet Place 1 tablet (0.4 mg total) under the tongue every 5 (five) minutes as needed for up to 25 days for chest pain. 25 tablet 3   pantoprazole  (PROTONIX) 40 MG tablet Take 40 mg by mouth daily.     valsartan (DIOVAN) 160 MG tablet Take 160 mg by mouth daily.     Vitamin D, Ergocalciferol, (DRISDOL) 1.25 MG (50000 UT) CAPS capsule Take 50,000 Units by mouth every  7 (seven) days.     No facility-administered medications prior to visit.     Medication list after today's encounter   Current Outpatient Medications  Medication Instructions   acetaminophen (TYLENOL) 650 mg, Oral, Every 6 hours PRN   aspirin (ASPIRIN CHILDRENS) 81 mg, Oral, Daily   atorvastatin (LIPITOR) 20 mg, Oral, Daily   ezetimibe (ZETIA) 10 mg, Oral, Daily after supper   famotidine (PEPCID) 40 MG tablet TAKE 1 TABLET BY MOUTH EVERY DAY   FLUoxetine (PROZAC) 20 mg, Oral, Daily   hydrochlorothiazide (HYDRODIURIL) 25 mg, Oral, Daily   isosorbide dinitrate (ISORDIL) 30 mg, Oral, 2 times daily   metFORMIN (GLUCOPHAGE) 500 MG tablet Oral, 2 times daily with meals   metoprolol succinate (TOPROL-XL) 100 mg, Oral, Daily, Take with or immediately following a meal.    nitroGLYCERIN (NITROSTAT) 0.4 mg, Sublingual, Every 5 min PRN   pantoprazole (PROTONIX) 40 mg, Oral, Daily   valsartan (DIOVAN) 160 mg, Oral, Daily   Vitamin D (Ergocalciferol) (DRISDOL) 50,000 Units, Oral, Every 7 days   Radiology:   No results found.  Cardiac Studies:   PCV MYOCARDIAL PERFUSION WO LEXISCAN 03/06/2021  Narrative Exercise Myoview stress test 03/06/2021: 1 Day Rest/Stress Protocol. Exercise time: 4 minutes 42 seconds on Bruce protocol, achieved 6.69 METS, 91% age-predicted maximum heart rate. Stress ECG negative for ischemia. Small size, mild intensity reversible perfusion defect involving the basal inferior and inferolateral segments suggestive of possible ischemia in either RCA/LCx distribution. LVEF 64% with normal left ventricular size and wall thickness. Of note the functional capacity is poor for age, peak stress ECG noted PVCs/ventricular couplets, and overall accuracy of  study limited due to soft tissue / breast attenuation given her BMI (38) and images performed in sitting position; therefore, clinical correlation required. Low-risk study.  PCV ECHOCARDIOGRAM COMPLETE 03/13/2021  Narrative Echocardiogram 03/13/2021: Normal LV systolic function with visual EF 55-60%. Left ventricle cavity is normal in size. Mild left ventricular hypertrophy. Presence of a septal bulge. Normal global wall motion. Indeterminate diastolic filling pattern, elevated LAP. Mild (Grade I) mitral regurgitation. Trace tricuspid regurgitation. No evidence of pulmonary hypertension. No prior study for comparison.     EKG:   EKG 02/08/2021: Normal sinus rhythm at rate of 86 bpm, left atrial enlargement, left axis deviation, left anterior fascicular block.  Incomplete right bundle branch block.  Poor R wave progression, cannot exclude anterolateral infarct old.    Assessment     ICD-10-CM   1. Angina pectoris (HCC)  I20.9     2. Gastroesophageal reflux disease without esophagitis  K21.9     3. Primary hypertension  I10     4. Nonspecific abnormal electrocardiogram (ECG) (EKG)  R94.31        There are no discontinued medications.   No orders of the defined types were placed in this encounter.   No orders of the defined types were placed in this encounter.  Recommendations:   Cynthia Knapp is a 70 y.o. Caucasian female patient with hypertension, hyperlipidemia, uncontrolled diabetes mellitus, morbid obesity history of premature coronary artery disease in both her parents at age <55 years referred to me for evaluation of chest pain and dyspnea on exertion.    Symptoms started about 5 to 6 months ago but have progressively gotten worse, described as exertional tightness in the neck that radiates to precordium.  It is associated with dyspnea on exertion which is also getting worse, no PND or orthopnea.  No rest pain.  I reviewed the results of  the stress test, on her last  office visit I had added isosorbide mononitrate which she is tolerating with mild headaches.  She is also on high-dose beta-blocker therapy and also on high intensity statins as well with risk factors well controlled and blood pressure is also well controlled.  Symptoms of angina still persist but have slightly improved on medical therapy.  I discussed the findings and recommended that we proceed with cardiac catheterization if symptoms do not resolve with exercise, weight loss and continued medical therapy.  She prefers to do medical therapy for now, would like to see her back in 3 months for follow-up.  Her husband is present at the bedside.  All questions answered.  Patient is aware to contact me if she has more frequent episodes of angina or chest pain.    Adrian Prows, MD, [facility] 03/18/2021, 11:30 AM Office: [phone]

## 2021-03-29 DIAGNOSIS — I209 Angina pectoris, unspecified: Secondary | ICD-10-CM

## 2021-03-29 MED ORDER — NICARDIPINE HCL 20 MG PO CAPS: 20.0000 mg | ORAL_CAPSULE | Freq: Three times a day (TID) | ORAL | 2 refills | Status: DC

## 2021-03-29 NOTE — Telephone Encounter (Signed)
ICD-10-CM   1. Angina pectoris (HCC)  I20.9 niCARdipine (CARDENE) 20 MG capsule      Meds ordered this encounter  Medications   niCARdipine (CARDENE) 20 MG capsule    Sig: Take 1 capsule (20 mg total) by mouth 3 (three) times daily.    Dispense:  90 capsule    Refill:  2    Medications Discontinued During This Encounter  Medication Reason   isosorbide dinitrate (ISORDIL) 30 MG tablet Side effect (s)   Headache.   Adrian Prows, MD, [facility] 03/29/2021, 3:13 PM Office: [phone] Fax: [phone] Pager: [phone]

## 2021-03-29 NOTE — Telephone Encounter (Signed)
From pt

## 2021-04-05 DIAGNOSIS — E1169 Type 2 diabetes mellitus with other specified complication: Secondary | ICD-10-CM | POA: Diagnosis not present

## 2021-04-05 DIAGNOSIS — I25118 Atherosclerotic heart disease of native coronary artery with other forms of angina pectoris: Secondary | ICD-10-CM | POA: Diagnosis not present

## 2021-04-05 NOTE — Telephone Encounter (Signed)
From pt

## 2021-04-08 DIAGNOSIS — E1169 Type 2 diabetes mellitus with other specified complication: Secondary | ICD-10-CM | POA: Diagnosis not present

## 2021-04-11 ENCOUNTER — Other Ambulatory Visit: Payer: Self-pay

## 2021-04-11 DIAGNOSIS — I209 Angina pectoris, unspecified: Secondary | ICD-10-CM

## 2021-04-11 DIAGNOSIS — E782 Mixed hyperlipidemia: Secondary | ICD-10-CM

## 2021-04-11 DIAGNOSIS — I10 Essential (primary) hypertension: Secondary | ICD-10-CM

## 2021-04-11 NOTE — Telephone Encounter (Signed)
Ok this has been done I placed the oredrs

## 2021-04-16 ENCOUNTER — Other Ambulatory Visit: Payer: Self-pay | Admitting: Cardiology

## 2021-04-16 DIAGNOSIS — E782 Mixed hyperlipidemia: Secondary | ICD-10-CM | POA: Diagnosis not present

## 2021-04-16 DIAGNOSIS — I209 Angina pectoris, unspecified: Secondary | ICD-10-CM | POA: Diagnosis not present

## 2021-04-16 DIAGNOSIS — I10 Essential (primary) hypertension: Secondary | ICD-10-CM | POA: Diagnosis not present

## 2021-04-17 ENCOUNTER — Other Ambulatory Visit: Payer: Self-pay | Admitting: Student

## 2021-04-17 DIAGNOSIS — E782 Mixed hyperlipidemia: Secondary | ICD-10-CM

## 2021-04-17 DIAGNOSIS — I10 Essential (primary) hypertension: Secondary | ICD-10-CM

## 2021-04-17 DIAGNOSIS — I209 Angina pectoris, unspecified: Secondary | ICD-10-CM

## 2021-04-17 LAB — CBC WITH DIFFERENTIAL
Basophils Absolute: 0.1 10*3/uL (ref 0.0–0.2)
Basos: 1 %
EOS (ABSOLUTE): 0.4 10*3/uL (ref 0.0–0.4)
Eos: 6 %
Hematocrit: 40.6 % (ref 34.0–46.6)
Hemoglobin: 13.5 g/dL (ref 11.1–15.9)
Immature Grans (Abs): 0 10*3/uL (ref 0.0–0.1)
Immature Granulocytes: 0 %
Lymphocytes Absolute: 2.8 10*3/uL (ref 0.7–3.1)
Lymphs: 41 %
MCH: 29.6 pg (ref 26.6–33.0)
MCHC: 33.3 g/dL (ref 31.5–35.7)
MCV: 89 fL (ref 79–97)
Monocytes Absolute: 0.6 10*3/uL (ref 0.1–0.9)
Monocytes: 9 %
Neutrophils Absolute: 3 10*3/uL (ref 1.4–7.0)
Neutrophils: 43 %
RBC: 4.56 x10E6/uL (ref 3.77–5.28)
RDW: 12.3 % (ref 11.7–15.4)
WBC: 6.8 10*3/uL (ref 3.4–10.8)

## 2021-04-17 LAB — BASIC METABOLIC PANEL
BUN/Creatinine Ratio: 15 (ref 12–28)
BUN: 20 mg/dL (ref 8–27)
CO2: 16 mmol/L — ABNORMAL LOW (ref 20–29)
Calcium: 9.2 mg/dL (ref 8.7–10.3)
Chloride: 102 mmol/L (ref 96–106)
Creatinine, Ser: 1.3 mg/dL — ABNORMAL HIGH (ref 0.57–1.00)
Glucose: 125 mg/dL — ABNORMAL HIGH (ref 70–99)
Potassium: 5.2 mmol/L (ref 3.5–5.2)
Sodium: 139 mmol/L (ref 134–144)
eGFR: 45 mL/min/{1.73_m2} — ABNORMAL LOW (ref >59)

## 2021-04-17 NOTE — Progress Notes (Signed)
Patient needs repeat labs in 2 weeks and cardiac cath after that.  She is presently scheduled for 10/25, this needs pushed back.

## 2021-04-17 NOTE — Progress Notes (Signed)
Spoke to patient, she reports she had been dealing with diarrhea and vomiting for the 2 to 3 days prior to having this lab work done.  Suspect increased creatinine may be related to dehydration.  Patient is agreeable to repeat lab testing in 2 weeks and rescheduling cardiac catheterization.

## 2021-04-23 ENCOUNTER — Ambulatory Visit (HOSPITAL_COMMUNITY): Admission: RE | Admit: 2021-04-23 | Payer: Medicare Other | Source: Home / Self Care | Admitting: Cardiology

## 2021-04-23 ENCOUNTER — Encounter (HOSPITAL_COMMUNITY): Admission: RE | Payer: Self-pay | Source: Home / Self Care

## 2021-04-23 SURGERY — LEFT HEART CATH AND CORONARY ANGIOGRAPHY
Anesthesia: LOCAL

## 2021-04-30 ENCOUNTER — Other Ambulatory Visit: Payer: Self-pay

## 2021-04-30 DIAGNOSIS — I10 Essential (primary) hypertension: Secondary | ICD-10-CM

## 2021-04-30 DIAGNOSIS — E119 Type 2 diabetes mellitus without complications: Secondary | ICD-10-CM

## 2021-04-30 NOTE — Telephone Encounter (Signed)
From patient.

## 2021-05-06 DIAGNOSIS — E119 Type 2 diabetes mellitus without complications: Secondary | ICD-10-CM | POA: Diagnosis not present

## 2021-05-06 DIAGNOSIS — I10 Essential (primary) hypertension: Secondary | ICD-10-CM | POA: Diagnosis not present

## 2021-05-07 LAB — CBC WITH DIFFERENTIAL
Basophils Absolute: 0.1 10*3/uL (ref 0.0–0.2)
Basos: 1 %
EOS (ABSOLUTE): 0.2 10*3/uL (ref 0.0–0.4)
Eos: 3 %
Hematocrit: 37.6 % (ref 34.0–46.6)
Hemoglobin: 12.3 g/dL (ref 11.1–15.9)
Immature Grans (Abs): 0 10*3/uL (ref 0.0–0.1)
Immature Granulocytes: 0 %
Lymphocytes Absolute: 3.2 10*3/uL — ABNORMAL HIGH (ref 0.7–3.1)
Lymphs: 40 %
MCH: 29.4 pg (ref 26.6–33.0)
MCHC: 32.7 g/dL (ref 31.5–35.7)
MCV: 90 fL (ref 79–97)
Monocytes Absolute: 0.6 10*3/uL (ref 0.1–0.9)
Monocytes: 8 %
Neutrophils Absolute: 3.9 10*3/uL (ref 1.4–7.0)
Neutrophils: 48 %
RBC: 4.18 x10E6/uL (ref 3.77–5.28)
RDW: 12.5 % (ref 11.7–15.4)
WBC: 8 10*3/uL (ref 3.4–10.8)

## 2021-05-07 LAB — BMP8+EGFR
BUN/Creatinine Ratio: 14 (ref 12–28)
BUN: 15 mg/dL (ref 8–27)
CO2: 24 mmol/L (ref 20–29)
Calcium: 9.3 mg/dL (ref 8.7–10.3)
Chloride: 101 mmol/L (ref 96–106)
Creatinine, Ser: 1.06 mg/dL — ABNORMAL HIGH (ref 0.57–1.00)
Glucose: 97 mg/dL (ref 70–99)
Potassium: 4.9 mmol/L (ref 3.5–5.2)
Sodium: 140 mmol/L (ref 134–144)
eGFR: 57 mL/min/{1.73_m2} — ABNORMAL LOW (ref >59)

## 2021-05-13 DIAGNOSIS — I209 Angina pectoris, unspecified: Secondary | ICD-10-CM

## 2021-05-14 ENCOUNTER — Encounter (HOSPITAL_COMMUNITY): Payer: Self-pay | Admitting: Cardiology

## 2021-05-14 ENCOUNTER — Encounter (HOSPITAL_COMMUNITY)
Admission: RE | Disposition: A | Discharge status: Home / Self Care | Payer: Self-pay | Source: Home / Self Care | Attending: Cardiology

## 2021-05-14 ENCOUNTER — Ambulatory Visit (HOSPITAL_COMMUNITY)
Admission: RE | Admit: 2021-05-14 | Discharge: 2021-05-14 | Disposition: A | Discharge status: Home / Self Care | Payer: Medicare Other | Attending: Cardiology | Admitting: Cardiology

## 2021-05-14 ENCOUNTER — Other Ambulatory Visit: Payer: Self-pay

## 2021-05-14 DIAGNOSIS — Z6838 Body mass index (BMI) 38.0-38.9, adult: Secondary | ICD-10-CM | POA: Diagnosis not present

## 2021-05-14 DIAGNOSIS — E785 Hyperlipidemia, unspecified: Secondary | ICD-10-CM | POA: Diagnosis not present

## 2021-05-14 DIAGNOSIS — E1165 Type 2 diabetes mellitus with hyperglycemia: Secondary | ICD-10-CM | POA: Insufficient documentation

## 2021-05-14 DIAGNOSIS — I10 Essential (primary) hypertension: Secondary | ICD-10-CM | POA: Insufficient documentation

## 2021-05-14 DIAGNOSIS — R079 Chest pain, unspecified: Secondary | ICD-10-CM | POA: Insufficient documentation

## 2021-05-14 DIAGNOSIS — Z8249 Family history of ischemic heart disease and other diseases of the circulatory system: Secondary | ICD-10-CM | POA: Insufficient documentation

## 2021-05-14 DIAGNOSIS — R0609 Other forms of dyspnea: Secondary | ICD-10-CM | POA: Insufficient documentation

## 2021-05-14 DIAGNOSIS — R9439 Abnormal result of other cardiovascular function study: Secondary | ICD-10-CM | POA: Insufficient documentation

## 2021-05-14 DIAGNOSIS — I209 Angina pectoris, unspecified: Secondary | ICD-10-CM

## 2021-05-14 HISTORY — PX: LEFT HEART CATH AND CORONARY ANGIOGRAPHY: CATH118249

## 2021-05-14 LAB — GLUCOSE, CAPILLARY: Glucose-Capillary: 92 mg/dL (ref 70–99)

## 2021-05-14 SURGERY — LEFT HEART CATH AND CORONARY ANGIOGRAPHY
Anesthesia: LOCAL

## 2021-05-14 MED ORDER — ASPIRIN 81 MG PO CHEW
81.0000 mg | CHEWABLE_TABLET | ORAL | Status: AC
  Administered 2021-05-14: 81 mg via ORAL
  Filled 2021-05-14: qty 1

## 2021-05-14 MED ORDER — VERAPAMIL HCL 2.5 MG/ML IV SOLN
INTRAVENOUS | Status: DC | PRN
  Administered 2021-05-14: 10 mL via INTRA_ARTERIAL

## 2021-05-14 MED ORDER — MIDAZOLAM HCL 2 MG/2ML IJ SOLN
INTRAMUSCULAR | Status: DC | PRN
  Administered 2021-05-14: 1 mg via INTRAVENOUS

## 2021-05-14 MED ORDER — SODIUM CHLORIDE 0.9 % WEIGHT BASED INFUSION: 1.0000 mL/kg/h | INTRAVENOUS | Status: DC

## 2021-05-14 MED ORDER — HEPARIN SODIUM (PORCINE) 1000 UNIT/ML IJ SOLN
INTRAMUSCULAR | Status: AC
  Filled 2021-05-14: qty 1

## 2021-05-14 MED ORDER — ACETAMINOPHEN 325 MG PO TABS: 650.0000 mg | ORAL_TABLET | ORAL | Status: DC | PRN

## 2021-05-14 MED ORDER — MIDAZOLAM HCL 2 MG/2ML IJ SOLN
INTRAMUSCULAR | Status: AC
  Filled 2021-05-14: qty 2

## 2021-05-14 MED ORDER — LABETALOL HCL 5 MG/ML IV SOLN: 10.0000 mg | INTRAVENOUS | Status: DC | PRN

## 2021-05-14 MED ORDER — SODIUM CHLORIDE 0.9% FLUSH: 3.0000 mL | Freq: Two times a day (BID) | INTRAVENOUS | Status: DC

## 2021-05-14 MED ORDER — VERAPAMIL HCL 2.5 MG/ML IV SOLN
INTRAVENOUS | Status: AC
  Filled 2021-05-14: qty 2

## 2021-05-14 MED ORDER — SODIUM CHLORIDE 0.9 % IV SOLN: 250.0000 mL | INTRAVENOUS | Status: DC | PRN

## 2021-05-14 MED ORDER — HEPARIN (PORCINE) IN NACL 1000-0.9 UT/500ML-% IV SOLN
INTRAVENOUS | Status: DC | PRN
  Administered 2021-05-14 (×2): 500 mL

## 2021-05-14 MED ORDER — LIDOCAINE HCL (PF) 1 % IJ SOLN
INTRAMUSCULAR | Status: AC
  Filled 2021-05-14: qty 30

## 2021-05-14 MED ORDER — IOHEXOL 350 MG/ML SOLN
INTRAVENOUS | Status: DC | PRN
  Administered 2021-05-14: 35 mL

## 2021-05-14 MED ORDER — FENTANYL CITRATE (PF) 100 MCG/2ML IJ SOLN
INTRAMUSCULAR | Status: AC
  Filled 2021-05-14: qty 2

## 2021-05-14 MED ORDER — SODIUM CHLORIDE 0.9 % WEIGHT BASED INFUSION
1.0000 mL/kg/h | INTRAVENOUS | Status: DC
  Administered 2021-05-14: 1 mL/kg/h via INTRAVENOUS

## 2021-05-14 MED ORDER — SODIUM CHLORIDE 0.9% FLUSH: 3.0000 mL | INTRAVENOUS | Status: DC | PRN

## 2021-05-14 MED ORDER — HEPARIN SODIUM (PORCINE) 1000 UNIT/ML IJ SOLN
INTRAMUSCULAR | Status: DC | PRN
  Administered 2021-05-14: 4500 [IU] via INTRAVENOUS

## 2021-05-14 MED ORDER — SODIUM CHLORIDE 0.9 % WEIGHT BASED INFUSION: 3.0000 mL/kg/h | INTRAVENOUS | Status: AC

## 2021-05-14 MED ORDER — LIDOCAINE HCL (PF) 1 % IJ SOLN
INTRAMUSCULAR | Status: DC | PRN
  Administered 2021-05-14: 2 mL

## 2021-05-14 MED ORDER — SODIUM CHLORIDE 0.9 % WEIGHT BASED INFUSION
3.0000 mL/kg/h | INTRAVENOUS | Status: DC
  Administered 2021-05-14: 3 mL/kg/h via INTRAVENOUS

## 2021-05-14 MED ORDER — FENTANYL CITRATE (PF) 100 MCG/2ML IJ SOLN
INTRAMUSCULAR | Status: DC | PRN
  Administered 2021-05-14: 50 ug via INTRAVENOUS

## 2021-05-14 MED ORDER — HYDRALAZINE HCL 20 MG/ML IJ SOLN: 10.0000 mg | INTRAMUSCULAR | Status: DC | PRN

## 2021-05-14 MED ORDER — HEPARIN (PORCINE) IN NACL 1000-0.9 UT/500ML-% IV SOLN
INTRAVENOUS | Status: AC
  Filled 2021-05-14: qty 1000

## 2021-05-14 MED ORDER — SODIUM CHLORIDE 0.9 % IV SOLN: INTRAVENOUS | Status: AC

## 2021-05-14 MED ORDER — ONDANSETRON HCL 4 MG/2ML IJ SOLN: 4.0000 mg | Freq: Four times a day (QID) | INTRAMUSCULAR | Status: DC | PRN

## 2021-05-14 SURGICAL SUPPLY — 9 items
CATH OPTITORQUE TIG 4.0 5F (CATHETERS) ×2 IMPLANT
DEVICE RAD COMP TR BAND LRG (VASCULAR PRODUCTS) ×2 IMPLANT
GLIDESHEATH SLEND A-KIT 6F 22G (SHEATH) ×2 IMPLANT
GUIDEWIRE INQWIRE 1.5J.035X260 (WIRE) ×1 IMPLANT
INQWIRE 1.5J .035X260CM (WIRE) ×2
KIT HEART LEFT (KITS) ×2 IMPLANT
PACK CARDIAC CATHETERIZATION (CUSTOM PROCEDURE TRAY) ×2 IMPLANT
TRANSDUCER W/STOPCOCK (MISCELLANEOUS) ×2 IMPLANT
TUBING CIL FLEX 10 FLL-RA (TUBING) ×2 IMPLANT

## 2021-05-14 NOTE — H&P (Addendum)
Cynthia Knapp is an 70 y.o. female.   Chief Complaint: Chest pain HPI:   70 y.o. Caucasian female patient with hypertension, hyperlipidemia, uncontrolled diabetes mellitus, morbid obesity, family h/o early CAD, now with worsening chest pain on exertion   Symptoms started about 6-8 months ago but have progressively gotten worse, described as exertional tightness in the neck that radiates to precordium.  It is associated with dyspnea on exertion which is also getting worse, no PND or orthopnea.  No rest pain.   With symptoms suggestive of angina pectoris, she was started on aggressive medical therapy.  In spite of this she continued to have exertional chest pain and worsening dyspnea on exertion.  She has had a intermediate risk nuclear study.  She now presents to the hospital for elective left heart catheterization and possible intervention.  Past Medical History:  Diagnosis Date   Hypertension     Past Surgical History:  Procedure Laterality Date   REPLACEMENT TOTAL KNEE Bilateral 2008     Family History  Problem Relation Age of Onset   Heart disease Mother 24       CAD. Died at age 20   Heart failure Mother    Heart disease Father 69       MI and diet at age 41Y   Heart disease Brother        COPD and died at age 72    Social History:  reports that she has never smoked. She has never used smokeless tobacco. She reports that she does not drink alcohol and does not use drugs.  Allergies:  Allergies  Allergen Reactions   Amlodipine Swelling    Ankle    Review of Systems  Constitutional: Negative for decreased appetite, malaise/fatigue, weight gain and weight loss.  HENT:  Negative for congestion.   Eyes:  Negative for visual disturbance.  Cardiovascular:  Positive for chest pain and dyspnea on exertion. Negative for leg swelling, palpitations and syncope.  Respiratory:  Negative for cough.   Endocrine: Negative for cold intolerance.  Hematologic/Lymphatic: Does not  bruise/bleed easily.  Skin:  Negative for itching and rash.  Musculoskeletal:  Negative for myalgias.  Gastrointestinal:  Negative for abdominal pain, nausea and vomiting.  Genitourinary:  Negative for dysuria.  Neurological:  Negative for dizziness and weakness.  Psychiatric/Behavioral:  The patient is not nervous/anxious.   All other systems reviewed and are negative.   Blood pressure (!) 147/86, pulse 60, temperature 98.3 F (36.8 C), temperature source Oral, resp. rate 15, height 5\' 2"  (1.575 m), weight 93 kg, SpO2 97 %. Body mass index is 37.49 kg/m.  Physical Exam Vitals and nursing note reviewed.  Constitutional:      General: She is not in acute distress.    Appearance: She is well-developed. She is obese.  HENT:     Head: Normocephalic and atraumatic.  Eyes:     Conjunctiva/sclera: Conjunctivae normal.     Pupils: Pupils are equal, round, and reactive to light.  Neck:     Vascular: No JVD.  Cardiovascular:     Rate and Rhythm: Normal rate and regular rhythm.     Pulses: Normal pulses and intact distal pulses.     Heart sounds: No murmur heard. Pulmonary:     Effort: Pulmonary effort is normal.     Breath sounds: Normal breath sounds. No wheezing or rales.  Abdominal:     General: Bowel sounds are normal.     Palpations: Abdomen is soft.  Tenderness: There is no rebound.  Musculoskeletal:        General: No tenderness. Normal range of motion.     Right lower leg: No edema.     Left lower leg: No edema.  Lymphadenopathy:     Cervical: No cervical adenopathy.  Skin:    General: Skin is warm and dry.  Neurological:     Mental Status: She is alert and oriented to person, place, and time.     Cranial Nerves: No cranial nerve deficit.    No results found for this or any previous visit (from the past 48 hour(s)).  Labs:   Lab Results  Component Value Date   WBC 8.0 05/06/2021   HGB 12.3 05/06/2021   HCT 37.6 05/06/2021   MCV 90 05/06/2021   PLT 289  09/08/2019   No results for input(s): NA, K, CL, CO2, BUN, CREATININE, CALCIUM, PROT, BILITOT, ALKPHOS, ALT, AST, GLUCOSE in the last 168 hours.  Invalid input(s): LABALBU  Lipid Panel  No results found for: CHOL, TRIG, HDL, CHOLHDL, VLDL, LDLCALC  BNP (last 3 results) No results for input(s): BNP in the last 8760 hours.  HEMOGLOBIN A1C No results found for: HGBA1C, MPG  Cardiac Panel (last 3 results) No results for input(s): CKTOTAL, CKMB, RELINDX in the last 8760 hours.  Invalid input(s): TROPONINHS  No results found for: CKTOTAL, CKMB, CKMBINDEX   TSH No results for input(s): TSH in the last 8760 hours.   Medications Prior to Admission  Medication Sig Dispense Refill   acetaminophen (TYLENOL) 650 MG CR tablet Take 1,300 mg by mouth 2 (two) times daily as needed for pain.     aspirin (ASPIRIN CHILDRENS) 81 MG chewable tablet Chew 1 tablet (81 mg total) by mouth daily. 90 tablet 3   atorvastatin (LIPITOR) 20 MG tablet Take 20 mg by mouth at bedtime.     ezetimibe (ZETIA) 10 MG tablet Take 1 tablet (10 mg total) by mouth daily after supper. 90 tablet 3   famotidine (PEPCID) 40 MG tablet TAKE 1 TABLET BY MOUTH EVERY DAY 90 tablet 1   FLUoxetine (PROZAC) 20 MG capsule Take 20 mg by mouth at bedtime.     hydrochlorothiazide (HYDRODIURIL) 25 MG tablet Take 25 mg by mouth daily.     isosorbide dinitrate (ISORDIL) 30 MG tablet Take 30 mg by mouth 2 (two) times daily.     metFORMIN (GLUCOPHAGE) 500 MG tablet Take 1,000 mg by mouth 2 (two) times daily with a meal.     metoprolol succinate (TOPROL-XL) 100 MG 24 hr tablet Take 100 mg by mouth every evening. Take with or immediately following a meal.     nitroGLYCERIN (NITROSTAT) 0.4 MG SL tablet Place 1 tablet (0.4 mg total) under the tongue every 5 (five) minutes as needed for up to 25 days for chest pain. 25 tablet 3   pantoprazole (PROTONIX) 40 MG tablet Take 40 mg by mouth daily as needed (GERD).     valsartan (DIOVAN) 160 MG tablet  Take 160 mg by mouth daily.     Vitamin D, Ergocalciferol, (DRISDOL) 1.25 MG (50000 UT) CAPS capsule Take 50,000 Units by mouth every Friday.        Current Facility-Administered Medications:    0.9 %  sodium chloride infusion, 250 mL, Intravenous, PRN, Adrian Prows, MD   0.9% sodium chloride infusion, 3 mL/kg/hr, Intravenous, Continuous **FOLLOWED BY** 0.9% sodium chloride infusion, 1 mL/kg/hr, Intravenous, Continuous, Adrian Prows, MD   sodium chloride flush (NS) 0.9 %  injection 3 mL, 3 mL, Intravenous, Q12H, Adrian Prows, MD   sodium chloride flush (NS) 0.9 % injection 3 mL, 3 mL, Intravenous, PRN, Adrian Prows, MD   Today's Vitals   05/14/21 0732  BP: (!) 147/86  Pulse: 60  Resp: 15  Temp: 98.3 F (36.8 C)  TempSrc: Oral  SpO2: 97%  Weight: 93 kg  Height: 5\' 2"  (1.575 m)   Body mass index is 37.49 kg/m.    CARDIAC STUDIES:  PCV MYOCARDIAL PERFUSION WO LEXISCAN 03/06/2021   Narrative Exercise Myoview stress test 03/06/2021: 1 Day Rest/Stress Protocol. Exercise time: 4 minutes 42 seconds on Bruce protocol, achieved 6.69 METS, 91% age-predicted maximum heart rate. Stress ECG negative for ischemia. Small size, mild intensity reversible perfusion defect involving the basal inferior and inferolateral segments suggestive of possible ischemia in either RCA/LCx distribution. LVEF 64% with normal left ventricular size and wall thickness. Of note the functional capacity is poor for age, peak stress ECG noted PVCs/ventricular couplets, and overall accuracy of study limited due to soft tissue / breast attenuation given her BMI (38) and images performed in sitting position; therefore, clinical correlation required. Low-risk study.   Echocardiogram 03/13/2021: Normal LV systolic function with visual EF 55-60%. Left ventricle cavity is normal in size. Mild left ventricular hypertrophy. Presence of a septal bulge. Normal global wall motion. Indeterminate diastolic filling pattern, elevated  LAP. Mild (Grade I) mitral regurgitation. Trace tricuspid regurgitation. No evidence of pulmonary hypertension. No prior study for comparison.      EKG:    EKG 02/08/2021:  Normal sinus rhythm at rate of 86 bpm, left atrial enlargement, left axis deviation, left anterior fascicular block.  Incomplete right bundle branch block.  Poor R wave progression, cannot exclude anterolateral infarct old   Assessment/Plan  70 y.o. Caucasian female patient with hypertension, hyperlipidemia, uncontrolled diabetes mellitus, morbid obesity, family h/o early CAD, now with worsening chest pain on exertion  Plan for left heart cath, coronary angiography, and possible intervention   Nigel Mormon, MD Pager: [phone] Office: [phone]

## 2021-05-14 NOTE — Progress Notes (Signed)
Discharge instructions reviewed with pt and her husband. Both voice understanding.  

## 2021-05-14 NOTE — Discharge Instructions (Signed)
Radial Site Care  This sheet gives you information about how to care for yourself after your procedure. Your health care provider may also give you more specific instructions. If you have problems or questions, contact your health care provider. What can I expect after the procedure? After the procedure, it is common to have: Bruising and tenderness at the catheter insertion area. Follow these instructions at home: Medicines Take over-the-counter and prescription medicines only as told by your health care provider. Insertion site care Follow instructions from your health care provider about how to take care of your insertion site. Make sure you: Wash your hands with soap and water before you remove your bandage (dressing). If soap and water are not available, use hand sanitizer. May remove dressing in 24 hours. Check your insertion site every day for signs of infection. Check for: Redness, swelling, or pain. Fluid or blood. Pus or a bad smell. Warmth. Do no take baths, swim, or use a hot tub for 5 days. You may shower 24-48 hours after the procedure. Remove the dressing and gently wash the site with plain soap and water. Pat the area dry with a clean towel. Do not rub the site. That could cause bleeding. Do not apply powder or lotion to the site. Activity  For 24 hours after the procedure, or as directed by your health care provider: Do not flex or bend the affected arm. Do not push or pull heavy objects with the affected arm. Do not drive yourself home from the hospital or clinic. You may drive 24 hours after the procedure. Do not operate machinery or power tools. KEEP ARM ELEVATED THE REMAINDER OF THE DAY. Do not push, pull or lift anything that is heavier than 10 lb for 5 days. Ask your health care provider when it is okay to: Return to work or school. Resume usual physical activities or sports. Resume sexual activity. General instructions If the catheter site starts to  bleed, raise your arm and put firm pressure on the site. If the bleeding does not stop, get help right away. This is a medical emergency. DRINK PLENTY OF FLUIDS FOR THE NEXT 2-3 DAYS. No alcohol consumption for 24 hours after receiving sedation. If you went home on the same day as your procedure, a responsible adult should be with you for the first 24 hours after you arrive home. Keep all follow-up visits as told by your health care provider. This is important. Contact a health care provider if: You have a fever. You have redness, swelling, or yellow drainage around your insertion site. Get help right away if: You have unusual pain at the radial site. The catheter insertion area swells very fast. The insertion area is bleeding, and the bleeding does not stop when you hold steady pressure on the area. Your arm or hand becomes pale, cool, tingly, or numb. These symptoms may represent a serious problem that is an emergency. Do not wait to see if the symptoms will go away. Get medical help right away. Call your local emergency services (911 in the [place] Do not drive yourself to the hospital. Summary After the procedure, it is common to have bruising and tenderness at the site. Follow instructions from your health care provider about how to take care of your radial site wound. Check the wound every day for signs of infection.  This information is not intended to replace advice given to you by your health care provider. Make sure you discuss any questions you have with   your health care provider. Document Revised: 07/22/2017 Document Reviewed: 07/22/2017 Elsevier Patient Education  2020 [employer]  

## 2021-05-14 NOTE — Interval H&P Note (Signed)
History and Physical Interval Note:  05/14/2021 9:05 AM  Cynthia Knapp  has presented today for surgery, with the diagnosis of chest pain - angina.  The various methods of treatment have been discussed with the patient and family. After consideration of risks, benefits and other options for treatment, the patient has consented to  Procedure(s): LEFT HEART CATH AND CORONARY ANGIOGRAPHY (N/A) as a surgical intervention.  The patient's history has been reviewed, patient examined, no change in status, stable for surgery.  I have reviewed the patient's chart and labs.  Questions were answered to the patient's satisfaction.    2016/2017 Appropriate Use Criteria for Coronary Revascularization Symptom Status: Ischemic Symptoms  Non-invasive Testing: Intermediate Risk  If no or indeterminate stress test, FFR/iFR results in all diseased vessels: N/A  Diabetes Mellitus: Yes  S/P CABG: No  Antianginal therapy (number of long-acting drugs): >=2  Patient undergoing renal transplant: No  Patient undergoing percutaneous valve procedure: No  1 Vessel Disease PCI CABG  No proximal LAD involvement, No proximal left dominant LCX involvement A (8); Indication 2 M (6); Indication 2  Proximal left dominant LCX involvement A (8); Indication 5 A (8); Indication 5  Proximal LAD involvement A (8); Indication 5 A (8); Indication 5  2 Vessel Disease  No proximal LAD involvement A (8); Indication 8 A (7); Indication 8  Proximal LAD involvement A (8); Indication 14 A (9); Indication 14  3 Vessel Disease  Low disease complexity (e.g., focal stenoses, SYNTAX <=22) A (7); Indication 19 A (9); Indication 19  Intermediate or high disease complexity (e.g., SYNTAX >=23) M (6); Indication 23 A (9); Indication 23  Left Main Disease  Isolated LMCA disease: ostial or midshaft A (7); Indication 24 A (9); Indication 24  Isolated LMCA disease: bifurcation involvement M (6); Indication 25 A (9); Indication 25  LMCA ostial or  midshaft, concurrent low disease burden multivessel disease (e.g., 1-2 additional focal stenoses, SYNTAX <=22) A (7); Indication 26 A (9); Indication 26  LMCA ostial or midshaft, concurrent intermediate or high disease burden multivessel disease (e.g., 1-2 additional bifurcation stenoses, long stenoses, SYNTAX >=23) M (4); Indication 27 A (9); Indication 27  LMCA bifurcation involvement, concurrent low disease burden multivessel disease (e.g., 1-2 additional focal stenoses, SYNTAX <=22) M (6); Indication 28 A (9); Indication 28  LMCA bifurcation involvement, concurrent intermediate or high disease burden multivessel disease (e.g., 1-2 additional bifurcation stenoses, long stenoses, SYNTAX >=23) R (3); Indication 29 A (9); Indication Holden

## 2021-05-21 ENCOUNTER — Other Ambulatory Visit: Payer: Self-pay | Admitting: Cardiology

## 2021-05-21 ENCOUNTER — Encounter: Payer: Self-pay | Admitting: Cardiology

## 2021-05-21 DIAGNOSIS — I209 Angina pectoris, unspecified: Secondary | ICD-10-CM

## 2021-05-21 NOTE — Telephone Encounter (Signed)
From patient.

## 2021-06-19 ENCOUNTER — Ambulatory Visit: Payer: PRIVATE HEALTH INSURANCE | Admitting: Cardiology

## 2021-08-16 DIAGNOSIS — F325 Major depressive disorder, single episode, in full remission: Secondary | ICD-10-CM | POA: Diagnosis not present

## 2021-08-16 DIAGNOSIS — K219 Gastro-esophageal reflux disease without esophagitis: Secondary | ICD-10-CM | POA: Diagnosis not present

## 2021-08-16 DIAGNOSIS — E1169 Type 2 diabetes mellitus with other specified complication: Secondary | ICD-10-CM | POA: Diagnosis not present

## 2021-08-16 DIAGNOSIS — I25118 Atherosclerotic heart disease of native coronary artery with other forms of angina pectoris: Secondary | ICD-10-CM | POA: Diagnosis not present

## 2021-08-16 DIAGNOSIS — Z0000 Encounter for general adult medical examination without abnormal findings: Secondary | ICD-10-CM | POA: Diagnosis not present

## 2021-08-16 DIAGNOSIS — Z1389 Encounter for screening for other disorder: Secondary | ICD-10-CM | POA: Diagnosis not present

## 2021-08-16 DIAGNOSIS — E7800 Pure hypercholesterolemia, unspecified: Secondary | ICD-10-CM | POA: Diagnosis not present

## 2021-08-16 DIAGNOSIS — I10 Essential (primary) hypertension: Secondary | ICD-10-CM | POA: Diagnosis not present

## 2021-08-16 DIAGNOSIS — Z23 Encounter for immunization: Secondary | ICD-10-CM | POA: Diagnosis not present

## 2021-09-16 DIAGNOSIS — D3131 Benign neoplasm of right choroid: Secondary | ICD-10-CM | POA: Diagnosis not present

## 2021-09-16 DIAGNOSIS — H33101 Unspecified retinoschisis, right eye: Secondary | ICD-10-CM | POA: Diagnosis not present

## 2021-09-16 DIAGNOSIS — H43822 Vitreomacular adhesion, left eye: Secondary | ICD-10-CM | POA: Diagnosis not present

## 2021-09-16 DIAGNOSIS — H35363 Drusen (degenerative) of macula, bilateral: Secondary | ICD-10-CM | POA: Diagnosis not present

## 2021-09-16 DIAGNOSIS — H43811 Vitreous degeneration, right eye: Secondary | ICD-10-CM | POA: Diagnosis not present

## 2022-01-14 DIAGNOSIS — D3131 Benign neoplasm of right choroid: Secondary | ICD-10-CM | POA: Diagnosis not present

## 2022-01-14 DIAGNOSIS — H33101 Unspecified retinoschisis, right eye: Secondary | ICD-10-CM | POA: Diagnosis not present

## 2022-01-14 DIAGNOSIS — H43822 Vitreomacular adhesion, left eye: Secondary | ICD-10-CM | POA: Diagnosis not present

## 2022-01-14 DIAGNOSIS — H35363 Drusen (degenerative) of macula, bilateral: Secondary | ICD-10-CM | POA: Diagnosis not present

## 2022-01-14 DIAGNOSIS — H43811 Vitreous degeneration, right eye: Secondary | ICD-10-CM | POA: Diagnosis not present

## 2022-01-29 DIAGNOSIS — H2513 Age-related nuclear cataract, bilateral: Secondary | ICD-10-CM | POA: Diagnosis not present

## 2022-01-29 DIAGNOSIS — H35363 Drusen (degenerative) of macula, bilateral: Secondary | ICD-10-CM | POA: Diagnosis not present

## 2022-01-29 DIAGNOSIS — D3131 Benign neoplasm of right choroid: Secondary | ICD-10-CM | POA: Diagnosis not present

## 2022-02-13 DIAGNOSIS — E7800 Pure hypercholesterolemia, unspecified: Secondary | ICD-10-CM | POA: Diagnosis not present

## 2022-02-13 DIAGNOSIS — M545 Low back pain, unspecified: Secondary | ICD-10-CM | POA: Diagnosis not present

## 2022-02-13 DIAGNOSIS — Z6838 Body mass index (BMI) 38.0-38.9, adult: Secondary | ICD-10-CM | POA: Diagnosis not present

## 2022-02-13 DIAGNOSIS — Z7984 Long term (current) use of oral hypoglycemic drugs: Secondary | ICD-10-CM | POA: Diagnosis not present

## 2022-02-13 DIAGNOSIS — K219 Gastro-esophageal reflux disease without esophagitis: Secondary | ICD-10-CM | POA: Diagnosis not present

## 2022-02-13 DIAGNOSIS — M542 Cervicalgia: Secondary | ICD-10-CM | POA: Diagnosis not present

## 2022-02-13 DIAGNOSIS — E1169 Type 2 diabetes mellitus with other specified complication: Secondary | ICD-10-CM | POA: Diagnosis not present

## 2022-02-13 DIAGNOSIS — G8929 Other chronic pain: Secondary | ICD-10-CM | POA: Diagnosis not present

## 2022-02-13 DIAGNOSIS — F325 Major depressive disorder, single episode, in full remission: Secondary | ICD-10-CM | POA: Diagnosis not present

## 2022-02-13 DIAGNOSIS — I10 Essential (primary) hypertension: Secondary | ICD-10-CM | POA: Diagnosis not present

## 2022-03-04 DIAGNOSIS — L570 Actinic keratosis: Secondary | ICD-10-CM | POA: Diagnosis not present

## 2022-03-04 DIAGNOSIS — D485 Neoplasm of uncertain behavior of skin: Secondary | ICD-10-CM | POA: Diagnosis not present

## 2022-03-04 DIAGNOSIS — L820 Inflamed seborrheic keratosis: Secondary | ICD-10-CM | POA: Diagnosis not present

## 2022-03-04 DIAGNOSIS — L821 Other seborrheic keratosis: Secondary | ICD-10-CM | POA: Diagnosis not present

## 2022-05-09 DIAGNOSIS — L820 Inflamed seborrheic keratosis: Secondary | ICD-10-CM | POA: Diagnosis not present

## 2022-05-24 ENCOUNTER — Other Ambulatory Visit: Payer: Self-pay | Admitting: Cardiology

## 2022-05-24 DIAGNOSIS — I209 Angina pectoris, unspecified: Secondary | ICD-10-CM

## 2022-05-28 DIAGNOSIS — D487 Neoplasm of uncertain behavior of other specified sites: Secondary | ICD-10-CM | POA: Diagnosis not present

## 2022-07-29 DIAGNOSIS — Z6837 Body mass index (BMI) 37.0-37.9, adult: Secondary | ICD-10-CM | POA: Diagnosis not present

## 2022-07-29 DIAGNOSIS — M542 Cervicalgia: Secondary | ICD-10-CM | POA: Diagnosis not present

## 2022-07-29 DIAGNOSIS — M75102 Unspecified rotator cuff tear or rupture of left shoulder, not specified as traumatic: Secondary | ICD-10-CM | POA: Diagnosis not present

## 2022-07-29 DIAGNOSIS — M25512 Pain in left shoulder: Secondary | ICD-10-CM | POA: Diagnosis not present

## 2022-07-29 DIAGNOSIS — M47896 Other spondylosis, lumbar region: Secondary | ICD-10-CM | POA: Diagnosis not present

## 2022-07-29 DIAGNOSIS — M545 Low back pain, unspecified: Secondary | ICD-10-CM | POA: Diagnosis not present

## 2022-07-29 DIAGNOSIS — M4722 Other spondylosis with radiculopathy, cervical region: Secondary | ICD-10-CM | POA: Diagnosis not present

## 2022-08-04 DIAGNOSIS — M5451 Vertebrogenic low back pain: Secondary | ICD-10-CM | POA: Diagnosis not present

## 2022-08-04 DIAGNOSIS — M25512 Pain in left shoulder: Secondary | ICD-10-CM | POA: Diagnosis not present

## 2022-08-04 DIAGNOSIS — M4722 Other spondylosis with radiculopathy, cervical region: Secondary | ICD-10-CM | POA: Diagnosis not present

## 2022-08-07 DIAGNOSIS — M5451 Vertebrogenic low back pain: Secondary | ICD-10-CM | POA: Diagnosis not present

## 2022-08-07 DIAGNOSIS — M25512 Pain in left shoulder: Secondary | ICD-10-CM | POA: Diagnosis not present

## 2022-08-07 DIAGNOSIS — M4722 Other spondylosis with radiculopathy, cervical region: Secondary | ICD-10-CM | POA: Diagnosis not present

## 2022-08-12 DIAGNOSIS — M4722 Other spondylosis with radiculopathy, cervical region: Secondary | ICD-10-CM | POA: Diagnosis not present

## 2022-08-12 DIAGNOSIS — M5451 Vertebrogenic low back pain: Secondary | ICD-10-CM | POA: Diagnosis not present

## 2022-08-12 DIAGNOSIS — M25512 Pain in left shoulder: Secondary | ICD-10-CM | POA: Diagnosis not present

## 2022-08-14 DIAGNOSIS — M25512 Pain in left shoulder: Secondary | ICD-10-CM | POA: Diagnosis not present

## 2022-08-14 DIAGNOSIS — M5451 Vertebrogenic low back pain: Secondary | ICD-10-CM | POA: Diagnosis not present

## 2022-08-14 DIAGNOSIS — M4722 Other spondylosis with radiculopathy, cervical region: Secondary | ICD-10-CM | POA: Diagnosis not present

## 2022-08-18 DIAGNOSIS — Z0000 Encounter for general adult medical examination without abnormal findings: Secondary | ICD-10-CM | POA: Diagnosis not present

## 2022-08-18 DIAGNOSIS — Z1389 Encounter for screening for other disorder: Secondary | ICD-10-CM | POA: Diagnosis not present

## 2022-08-19 ENCOUNTER — Other Ambulatory Visit: Payer: Self-pay | Admitting: Family Medicine

## 2022-08-19 DIAGNOSIS — M25512 Pain in left shoulder: Secondary | ICD-10-CM | POA: Diagnosis not present

## 2022-08-19 DIAGNOSIS — M5451 Vertebrogenic low back pain: Secondary | ICD-10-CM | POA: Diagnosis not present

## 2022-08-19 DIAGNOSIS — M4722 Other spondylosis with radiculopathy, cervical region: Secondary | ICD-10-CM | POA: Diagnosis not present

## 2022-08-19 DIAGNOSIS — Z1231 Encounter for screening mammogram for malignant neoplasm of breast: Secondary | ICD-10-CM

## 2022-08-26 DIAGNOSIS — M25512 Pain in left shoulder: Secondary | ICD-10-CM | POA: Diagnosis not present

## 2022-08-26 DIAGNOSIS — M5451 Vertebrogenic low back pain: Secondary | ICD-10-CM | POA: Diagnosis not present

## 2022-08-26 DIAGNOSIS — M4722 Other spondylosis with radiculopathy, cervical region: Secondary | ICD-10-CM | POA: Diagnosis not present

## 2022-08-28 DIAGNOSIS — Z79899 Other long term (current) drug therapy: Secondary | ICD-10-CM | POA: Diagnosis not present

## 2022-08-28 DIAGNOSIS — T148XXA Other injury of unspecified body region, initial encounter: Secondary | ICD-10-CM | POA: Diagnosis not present

## 2022-08-28 DIAGNOSIS — I700 Atherosclerosis of aorta: Secondary | ICD-10-CM | POA: Diagnosis not present

## 2022-08-28 DIAGNOSIS — I10 Essential (primary) hypertension: Secondary | ICD-10-CM | POA: Diagnosis not present

## 2022-08-28 DIAGNOSIS — Z6839 Body mass index (BMI) 39.0-39.9, adult: Secondary | ICD-10-CM | POA: Diagnosis not present

## 2022-08-28 DIAGNOSIS — E7800 Pure hypercholesterolemia, unspecified: Secondary | ICD-10-CM | POA: Diagnosis not present

## 2022-08-28 DIAGNOSIS — E559 Vitamin D deficiency, unspecified: Secondary | ICD-10-CM | POA: Diagnosis not present

## 2022-08-28 DIAGNOSIS — F419 Anxiety disorder, unspecified: Secondary | ICD-10-CM | POA: Diagnosis not present

## 2022-08-28 DIAGNOSIS — I25118 Atherosclerotic heart disease of native coronary artery with other forms of angina pectoris: Secondary | ICD-10-CM | POA: Diagnosis not present

## 2022-08-28 DIAGNOSIS — E1169 Type 2 diabetes mellitus with other specified complication: Secondary | ICD-10-CM | POA: Diagnosis not present

## 2022-08-28 DIAGNOSIS — K219 Gastro-esophageal reflux disease without esophagitis: Secondary | ICD-10-CM | POA: Diagnosis not present

## 2022-09-04 DIAGNOSIS — M4722 Other spondylosis with radiculopathy, cervical region: Secondary | ICD-10-CM | POA: Diagnosis not present

## 2022-09-04 DIAGNOSIS — M25512 Pain in left shoulder: Secondary | ICD-10-CM | POA: Diagnosis not present

## 2022-09-04 DIAGNOSIS — M5451 Vertebrogenic low back pain: Secondary | ICD-10-CM | POA: Diagnosis not present

## 2022-09-05 DIAGNOSIS — L820 Inflamed seborrheic keratosis: Secondary | ICD-10-CM | POA: Diagnosis not present

## 2022-09-09 DIAGNOSIS — M25512 Pain in left shoulder: Secondary | ICD-10-CM | POA: Diagnosis not present

## 2022-09-09 DIAGNOSIS — M5451 Vertebrogenic low back pain: Secondary | ICD-10-CM | POA: Diagnosis not present

## 2022-09-09 DIAGNOSIS — M4722 Other spondylosis with radiculopathy, cervical region: Secondary | ICD-10-CM | POA: Diagnosis not present

## 2022-10-15 ENCOUNTER — Ambulatory Visit
Admission: RE | Admit: 2022-10-15 | Discharge: 2022-10-15 | Disposition: A | Discharge status: Home / Self Care | Payer: Medicare Other | Source: Ambulatory Visit | Attending: Family Medicine | Admitting: Family Medicine

## 2022-10-15 DIAGNOSIS — Z1231 Encounter for screening mammogram for malignant neoplasm of breast: Secondary | ICD-10-CM

## 2022-10-16 ENCOUNTER — Ambulatory Visit: Payer: Medicare Other

## 2022-10-23 DIAGNOSIS — M2550 Pain in unspecified joint: Secondary | ICD-10-CM | POA: Diagnosis not present

## 2022-10-23 DIAGNOSIS — Z79899 Other long term (current) drug therapy: Secondary | ICD-10-CM | POA: Diagnosis not present

## 2022-10-23 DIAGNOSIS — Z6838 Body mass index (BMI) 38.0-38.9, adult: Secondary | ICD-10-CM | POA: Diagnosis not present

## 2022-10-23 DIAGNOSIS — M5441 Lumbago with sciatica, right side: Secondary | ICD-10-CM | POA: Diagnosis not present

## 2022-10-23 DIAGNOSIS — F419 Anxiety disorder, unspecified: Secondary | ICD-10-CM | POA: Diagnosis not present

## 2022-11-26 DIAGNOSIS — D487 Neoplasm of uncertain behavior of other specified sites: Secondary | ICD-10-CM | POA: Diagnosis not present

## 2023-01-19 DIAGNOSIS — M65341 Trigger finger, right ring finger: Secondary | ICD-10-CM | POA: Diagnosis not present

## 2023-01-19 DIAGNOSIS — E7800 Pure hypercholesterolemia, unspecified: Secondary | ICD-10-CM | POA: Diagnosis not present

## 2023-01-19 DIAGNOSIS — K219 Gastro-esophageal reflux disease without esophagitis: Secondary | ICD-10-CM | POA: Diagnosis not present

## 2023-01-19 DIAGNOSIS — Z79899 Other long term (current) drug therapy: Secondary | ICD-10-CM | POA: Diagnosis not present

## 2023-01-19 DIAGNOSIS — I25118 Atherosclerotic heart disease of native coronary artery with other forms of angina pectoris: Secondary | ICD-10-CM | POA: Diagnosis not present

## 2023-01-19 DIAGNOSIS — M1A0710 Idiopathic chronic gout, right ankle and foot, without tophus (tophi): Secondary | ICD-10-CM | POA: Diagnosis not present

## 2023-01-19 DIAGNOSIS — I10 Essential (primary) hypertension: Secondary | ICD-10-CM | POA: Diagnosis not present

## 2023-01-19 DIAGNOSIS — I700 Atherosclerosis of aorta: Secondary | ICD-10-CM | POA: Diagnosis not present

## 2023-01-19 DIAGNOSIS — E1169 Type 2 diabetes mellitus with other specified complication: Secondary | ICD-10-CM | POA: Diagnosis not present

## 2023-01-19 DIAGNOSIS — F325 Major depressive disorder, single episode, in full remission: Secondary | ICD-10-CM | POA: Diagnosis not present

## 2023-01-19 DIAGNOSIS — E559 Vitamin D deficiency, unspecified: Secondary | ICD-10-CM | POA: Diagnosis not present

## 2023-03-23 DIAGNOSIS — L309 Dermatitis, unspecified: Secondary | ICD-10-CM | POA: Diagnosis not present

## 2023-03-23 DIAGNOSIS — L814 Other melanin hyperpigmentation: Secondary | ICD-10-CM | POA: Diagnosis not present

## 2023-06-03 DIAGNOSIS — D487 Neoplasm of uncertain behavior of other specified sites: Secondary | ICD-10-CM | POA: Diagnosis not present

## 2023-09-15 ENCOUNTER — Other Ambulatory Visit: Payer: Self-pay | Admitting: Family Medicine

## 2023-09-15 DIAGNOSIS — Z1231 Encounter for screening mammogram for malignant neoplasm of breast: Secondary | ICD-10-CM

## 2023-10-19 ENCOUNTER — Ambulatory Visit
Admission: RE | Admit: 2023-10-19 | Discharge: 2023-10-19 | Disposition: A | Discharge status: Home / Self Care | Source: Ambulatory Visit | Attending: Family Medicine | Admitting: Family Medicine

## 2023-10-19 DIAGNOSIS — Z1231 Encounter for screening mammogram for malignant neoplasm of breast: Secondary | ICD-10-CM

## 2023-10-26 NOTE — Therapy (Unsigned)
 OUTPATIENT PHYSICAL THERAPY LOWER EXTREMITY EVALUATION   Patient Name: Cynthia Knapp MRN: [id number] DOB:[birth year], 73 y.o., female Today's Date: 10/26/2023  END OF SESSION:   Past Medical History:  Diagnosis Date   Hypertension    Past Surgical History:  Procedure Laterality Date   LEFT HEART CATH AND CORONARY ANGIOGRAPHY N/A 05/14/2021   Procedure: LEFT HEART CATH AND CORONARY ANGIOGRAPHY;  Surgeon: Cody Das, MD;  Location: [facility];  Service: Cardiovascular;  Laterality: N/A;   REPLACEMENT TOTAL KNEE Bilateral 2008   Patient Active Problem List   Diagnosis Date Noted   Angina pectoris (HCC) 05/13/2021    PCP: ***  REFERRING PROVIDER: ***  REFERRING DIAG: ***  THERAPY DIAG:  No diagnosis found.  Rationale for Evaluation and Treatment: {HABREHAB:27488}  ONSET DATE: ***  SUBJECTIVE:   SUBJECTIVE STATEMENT: ***  PERTINENT HISTORY: *** PAIN:  Are you having pain? {OPRCPAIN:27236}  PRECAUTIONS: {Therapy precautions:24002}  RED FLAGS: {PT Red Flags:29287}   WEIGHT BEARING RESTRICTIONS: {Yes ***/No:24003}  FALLS:  Has patient fallen in last 6 months? {fallsyesno:27318}  LIVING ENVIRONMENT: Lives with: {OPRC lives with:25569::"lives with their family"} Lives in: {Lives in:25570} Stairs: {opstairs:27293} Has following equipment at home: {Assistive devices:23999}  OCCUPATION: ***  PLOF: {PLOF:24004}  PATIENT GOALS: ***  NEXT MD VISIT: ***  OBJECTIVE:  Note: Objective measures were completed at Evaluation unless otherwise noted.  DIAGNOSTIC FINDINGS: ***  PATIENT SURVEYS:  {rehab surveys:24030}  COGNITION: Overall cognitive status: {cognition:24006}     SENSATION: {sensation:27233}  EDEMA:  {edema:24020}  MUSCLE LENGTH: Hamstrings: Right *** deg; Left *** deg Andy Bannister test: Right *** deg; Left *** deg  POSTURE: {posture:25561}  PALPATION: ***  LOWER EXTREMITY ROM:  {AROM/PROM:27142} ROM Right eval  Left eval  Hip flexion    Hip extension    Hip abduction    Hip adduction    Hip internal rotation    Hip external rotation    Knee flexion    Knee extension    Ankle dorsiflexion    Ankle plantarflexion    Ankle inversion    Ankle eversion     (Blank rows = not tested)  LOWER EXTREMITY MMT:  MMT Right eval Left eval  Hip flexion    Hip extension    Hip abduction    Hip adduction    Hip internal rotation    Hip external rotation    Knee flexion    Knee extension    Ankle dorsiflexion    Ankle plantarflexion    Ankle inversion    Ankle eversion     (Blank rows = not tested)  LOWER EXTREMITY SPECIAL TESTS:  {LEspecialtests:26242}  FUNCTIONAL TESTS:  {Functional tests:24029}  GAIT: Distance walked: *** Assistive device utilized: {Assistive devices:23999} Level of assistance: {Levels of assistance:24026} Comments: ***  TREATMENT DATE: ***    PATIENT EDUCATION:  Education details: *** Person educated: {Person educated:25204} Education method: {Education Method:25205} Education comprehension: {Education Comprehension:25206}  HOME EXERCISE PROGRAM: ***  ASSESSMENT:  CLINICAL IMPRESSION: Patient is a *** y.o. *** who was seen today for physical therapy evaluation and treatment for ***.   OBJECTIVE IMPAIRMENTS: {opptimpairments:25111}.   ACTIVITY LIMITATIONS: {activitylimitations:27494}  PARTICIPATION LIMITATIONS: {participationrestrictions:25113}  PERSONAL FACTORS: {Personal factors:25162} are also affecting patient's functional outcome.   REHAB POTENTIAL: {rehabpotential:25112}  CLINICAL DECISION MAKING: {clinical decision making:25114}  EVALUATION COMPLEXITY: {Evaluation complexity:25115}   GOALS: Goals reviewed with patient? {yes/no:20286}  SHORT TERM GOALS: Target date: *** *** Baseline: Goal status:  INITIAL  2.  *** Baseline:  Goal status: INITIAL  3.  *** Baseline:  Goal status: INITIAL  4.  *** Baseline:  Goal status: INITIAL  5.  *** Baseline:  Goal status: INITIAL  6.  *** Baseline:  Goal status: INITIAL  LONG TERM GOALS: Target date: ***  *** Baseline:  Goal status: INITIAL  2.  *** Baseline:  Goal status: INITIAL  3.  *** Baseline:  Goal status: INITIAL  4.  *** Baseline:  Goal status: INITIAL  5.  *** Baseline:  Goal status: INITIAL  6.  *** Baseline:  Goal status: INITIAL   PLAN:  PT FREQUENCY: {rehab frequency:25116}  PT DURATION: {rehab duration:25117}  PLANNED INTERVENTIONS: {rehab planned interventions:25118::"97110-Therapeutic exercises","97530- Therapeutic [id number]- Neuromuscular re-education","97535- Self HQIO","96295- Manual therapy"}  PLAN FOR NEXT SESSION: ***   Breck Calix, PT 10/26/2023, 8:16 PM

## 2023-10-27 ENCOUNTER — Ambulatory Visit: Attending: Sports Medicine | Admitting: Physical Therapy

## 2023-10-27 ENCOUNTER — Encounter: Payer: Self-pay | Admitting: Physical Therapy

## 2023-10-27 DIAGNOSIS — M5459 Other low back pain: Secondary | ICD-10-CM | POA: Insufficient documentation

## 2023-10-27 DIAGNOSIS — M6281 Muscle weakness (generalized): Secondary | ICD-10-CM | POA: Diagnosis present

## 2023-10-28 NOTE — Therapy (Signed)
 OUTPATIENT PHYSICAL THERAPY TREATMENT   Patient Name: Cynthia Knapp MRN: [id number] DOB:10-12-1950, 73 y.o., female Today's Date: 10/29/2023  END OF SESSION:  PT End of Session - 10/29/23 1623     Visit Number 2    Number of Visits 17    Date for PT Re-Evaluation 01/19/24    Authorization Type MEDICARE PART B reporting period from 10/27/2023    Progress Note Due on Visit 10    PT Start Time 0902    PT Stop Time 0946    PT Time Calculation (min) 44 min    Activity Tolerance Patient tolerated treatment well    Behavior During Therapy [facility] for tasks assessed/performed              Past Medical History:  Diagnosis Date   Hypertension    Past Surgical History:  Procedure Laterality Date   LEFT HEART CATH AND CORONARY ANGIOGRAPHY N/A 05/14/2021   Procedure: LEFT HEART CATH AND CORONARY ANGIOGRAPHY;  Surgeon: Cody Das, MD;  Location: [facility];  Service: Cardiovascular;  Laterality: N/A;   REPLACEMENT TOTAL KNEE Bilateral 2008   Patient Active Problem List   Diagnosis Date Noted   Angina pectoris (HCC) 05/13/2021    PCP: Perley Bradley, MD  REFERRING PROVIDER: Rance Burrows, MD (Sports Medicine)  REFERRING DIAG:  left hip pain, SI pain, GT pain, weak glute med, hip stability  THERAPY DIAG:  Other low back pain  Muscle weakness (generalized)  Rationale for Evaluation and Treatment: Rehabilitation  ONSET DATE: February 2025  SUBJECTIVE:   PERTINENT HISTORY: Patient is a 73 y.o. female who presents to outpatient physical therapy with a referral for medical diagnosis left hip pain, SI pain, GT pain, weak glute med, hip stability. This patient's chief complaints consist of bilateral LE weakness (L > R), low back pain, leading to the following functional deficits: limiting her function "to really get out and be a normal person," keep up with the grandkids, go on a trip to somewhere like [employer], stairs, gardening, bending. Relevant past medical  history and comorbidities include the following: she has detatched retina, Angina pectoris (HCC), palpitations, shortness of breath, gout, GERD, Diabetes Mellitis, right trigger finger, obesity, hypercholesteremia, atherosclerosis of aorta, atherosclerotic heart disease of native coronary artery with other forms of angina pectoris, major depressive disorder in full remission, she  has a past medical history of Hypertension. she  has a past surgical history that includes Replacement total knee (Bilateral, 2008) and LEFT HEART CATH AND CORONARY ANGIOGRAPHY (N/A, 05/14/2021). Patient denies hx of stroke, seizures, lung problems, unexplained weight loss, unexplained changes in bowel or bladder problems, unexplained stumbling or dropping things, osteoporosis, and spinal surgery  Exercise history: She walks her dogs 3 times a day, using a walking stick walking around her [place] acre property. She thinks it is 5-6 miles a day of walking a day. She has been doing this for the last 2 weeks. Before that was every other day. Recently it has been warmer out and she had more free time. She has been walking 4-5 years. She is active, doing planting, gardening, riding lawn mowers, walking 5-6 miles a day without pain. She dreads going up and down stairs.   SUBJECTIVE STATEMENT: Patient states she had a little back ache after initial evaluation but she did not have to take a tylenol . She currently has a little pian in the back. She has not had any heart symptoms since before last PT session and she has made  an appointment with her doctor who is in touch with her cardiologist. She has a nurse calling to check on her daily.    PAIN: NPRS: Current: 3/10 in the low back, does have L hip pain up to 6/10 when she is walking up an incline    Best: 2/10, Worst: 8/10. Pain location: middle low back Pain description: achy, can be sharp sometimes when bending, stiffens sometimes Aggravating factors: bending, when going up and down  stairs, walking up inclines on her property, too many twists in the swimming pool Relieving factors: hot shower, magnesium oil, heating pad, Voltaren gel,   PRECAUTIONS: None    PATIENT GOALS: "basically to maintain balance and get strength so I can go up and down stairs like a normal person"   NEXT MD VISIT: Next month  OBJECTIVE  Vitals:   10/29/23 0908  BP: 106/74  Pulse: 64  SpO2: 98%    SELF-REPORTED FUNCTION Patient Specific Functional Scale (PSFS)  Stairs: 4 Bending over: 6-7 Walking inclines: 6 Average: 5.5/10                                                                                                                               TREATMENT  Therapeutic exercise: therapeutic exercises that incorporate ONE parameter at one or more areas of the body to centralize symptoms, develop strength and endurance, range of motion, and flexibility required for successful completion of functional activities.  Vitals measurement for systems review (see above)  NuStep using bilateral upper and lower extremities. For improved extremity mobility, muscular endurance, and activity tolerance; and to induce the analgesic effect of aerobic exercise, stimulate improved joint nutrition, and prepare body structures and systems for following interventions. Also to reinforce understanding of appropriate exercise intensity to help meet physical activity guidelines for health.  Seat/handle setting: 6/8 5:43  minutes Level: 3 Target SPM: > 90 Average SPM: 89 RPE: 6/10  Hip hikes with U UE support 2x20 each side off of 2x4 board 1x20 each side off of step (harder) Added to HEP  Therapeutic activities: dynamic therapeutic activities incorporating MULTIPLE parameters or areas of the body designed to achieve improved functional performance. Tall kneel <> stand with knees on airex pad and UE support on chair 2x10 each side Effortful Added to HEP  Attempted split squat with U UE support  and airex pad under bottom knee.  1x5 each side Struggled with form due to strength deficits Lightheaded and feeling out of breath (without obvious increased labor of breathing) which required an extended break after each set. HR and SpO2 WNL.   Pt required multimodal cuing for proper technique and to facilitate improved neuromuscular control, strength, range of motion, and functional ability resulting in improved performance and form.    PATIENT EDUCATION:  Education details:  Exercise purpose/form. Self management techniques. Education on HEP including handout. Person educated: Patient Education method: Explanation Education comprehension: verbalized understanding and needs further education  HOME EXERCISE  PROGRAM: Access Code: [email or web address] URL: [email or web address] Date: 10/29/2023 Prepared by: Alleen Isle  Exercises - Hip Hikes off step  - 1 x daily - 3 sets - 20 reps  HOME EXERCISE PROGRAM [[id number]] View at [email or web address] code [email or web address] Standing to tall kneeling and back with chair support -  Repeat 10 Repetitions, Complete 3 Sets, Perform 3 Times a Week  ASSESSMENT:  CLINICAL IMPRESSION: Patient arrives with no further tachycardic symptoms and vitals were [facility]. However, patient did feel lightheaded following exercise with increased exertion. This seems to be the larger source of her unsteadiness on stairs instead of just hip weakness. Recommend checking BP next session during similar exercise since HR and SpO2 were normal. Today's session focused on initiating hip strengthening. Patient provided with HEP of exercises she tolerated well and found appropriately challenging. Patient would benefit from continued management of limiting condition by skilled physical therapist to address remaining impairments and functional limitations to work towards stated goals and return to PLOF or maximal functional independence.   From initial PT evaluation on 10/27/2023:  Patient  is a 73 y.o. female referred to outpatient physical therapy with a medical diagnosis of left hip pain, SI pain, GT pain, weak glute med, hip stability who presents with signs and symptoms consistent with chronic low back pain, left hip weakness, especially in glute med in setting of decreased LE muscle performance and unsteadiness on feet. Patient's main deficits seems to be related to left hip weakness which is also affecting her L single leg stance during stair climbing and ambulation over uneven ground. She also has chronic low back pain but no signs of nerve root irritation at this time. Her MMT for knee quads is normal, but this test does not test all aspects of muscle strength/performance needed for functional activities such as stair climbing and she has sensation of weakness during stepping up with the left leg on stair climb. She Patient presents with significant pain, ROM, joint stiffness, knowledge, muscle performance (strength/power/endurance), balance, and activity tolerance impairments that are limiting ability "to really get out and be a normal person," keep up with the grandkids, go on a trip to somewhere like [employer], stairs, gardening, bending without difficulty. Patient will benefit from skilled physical therapy intervention to address current body structure impairments and activity limitations to improve function and work towards goals set in current POC in order to return to prior level of function or maximal functional improvement.    OBJECTIVE IMPAIRMENTS: decreased activity tolerance, decreased balance, decreased endurance, decreased knowledge of condition, decreased mobility, difficulty walking, decreased ROM, decreased strength, increased muscle spasms, and pain.   ACTIVITY LIMITATIONS: carrying, lifting, bending, squatting, stairs, transfers, locomotion level, and caring for others  PARTICIPATION LIMITATIONS: interpersonal relationship, community activity, yard work, and    limiting her function "to really get out and be a normal person," keep up with the grandkids, go on a trip to somewhere like [employer], stairs, gardening, bending  PERSONAL FACTORS: Age, Past/current experiences, Time since onset of injury/illness/exacerbation, and 3+ comorbidities:   detatched retina, Angina pectoris (HCC), palpitations, shortness of breath, gout, GERD, Diabetes Mellitis, right trigger finger, obesity, hypercholesteremia, atherosclerosis of aorta, atherosclerotic heart disease of native coronary artery with other forms of angina pectoris, major depressive disorder in full remission, she  has a past medical history of Hypertension. she  has a past surgical history that includes Replacement total knee (Bilateral, 2008) and LEFT HEART CATH AND CORONARY ANGIOGRAPHY (N/A, 05/14/2021) are also affecting patient's functional outcome.  REHAB POTENTIAL: Good  CLINICAL DECISION MAKING: Evolving/moderate complexity  EVALUATION COMPLEXITY: Moderate   GOALS: Goals reviewed with patient? No  SHORT TERM GOALS: Target date: 11/10/2023  Patient will be independent with initial home exercise program for self-management of symptoms. Baseline: Initial HEP to be provided at visit 2 as appropriate (10/27/23); Goal status: In progress   LONG TERM GOALS: Target date: 01/19/2024  Patient will be independent with a long-term home exercise program for self-management of symptoms.  Baseline: Initial HEP to be provided at visit 2 as appropriate (10/27/23); Goal status: In progress  2.  Patient will demonstrate improvement in Patient Specific Functional Scale (PSFS) of equal or greater than 8/10 points to reflect clinically significant improvement in patient's most valued functional activities. Baseline: to be measured at visit 2 as appropriate (10/27/23); 5.5/10 (10/29/2023);  Goal status: In progress  3.  Patient will demonstrate improvement in B hip strength of equal or greater than 1/2 MMT  above baseline or at least 4+/5 to help her be able to navigate stairs with less unsteadiness and more success.  Baseline: 3-4+/5 (10/27/23); Goal status: In progress  4.  Patient will demonstrate the ability to confidently ascend/descend 16 steps with step over step gait pattern, no UE support, no perceived imbalance or increased pain beyond 1/10 to improve her ability to climb stairs at home and in the community.  Baseline: Ascent/Descent five 8-inch steps: descended with U UE support (touchdown) step over step. Ascended step over step with UE touchdown support. Slow stepping up both sides leading. Pain at right SIJ region with R step up and weakness over left lateral quad width left step up.  (10/27/23); Goal status: In progress  5.  Patient will report NPRS equal or less than 3/10 during functional activities during the last 2 weeks to improve their abilitly to complete community, work and/or recreational activities with less limitation. Baseline: 8/10 (10/27/23); Goal status: In progress   PLAN:  PT FREQUENCY: 1-2x/week  PT DURATION: 8-12 weeks  PLANNED INTERVENTIONS: 97164- PT Re-evaluation, 97750- Physical Performance Testing, 97110-Therapeutic exercises, 97530- Therapeutic activity, [id number]- Neuromuscular re-education, 97535- Self Care, 16109- Manual therapy, [phone]- Gait training, [phone]- Electrical stimulation (unattended), Patient/Family education, Balance training, Stair training, Dry Needling, Joint mobilization, Spinal mobilization, Cryotherapy, and Moist heat  PLAN FOR NEXT SESSION: update HEP as appropriate, progressive LE/functional/core strengthening/balance/ROM. Education. Manual therapy as needed.    Carilyn Charles. Artemio Larry, PT, DPT 10/29/23, 4:37 PM  [facility] [address] [place], [place] [address] P: [phone] I F: [phone]

## 2023-10-29 ENCOUNTER — Encounter: Payer: Self-pay | Admitting: Physical Therapy

## 2023-10-29 ENCOUNTER — Ambulatory Visit: Attending: Sports Medicine | Admitting: Physical Therapy

## 2023-10-29 VITALS — BP 106/74 | HR 64

## 2023-10-29 DIAGNOSIS — M5459 Other low back pain: Secondary | ICD-10-CM | POA: Diagnosis present

## 2023-10-29 DIAGNOSIS — M6281 Muscle weakness (generalized): Secondary | ICD-10-CM | POA: Diagnosis present

## 2023-11-04 ENCOUNTER — Encounter: Payer: Self-pay | Admitting: Physical Therapy

## 2023-11-04 ENCOUNTER — Ambulatory Visit: Admitting: Physical Therapy

## 2023-11-04 DIAGNOSIS — M5459 Other low back pain: Secondary | ICD-10-CM

## 2023-11-04 DIAGNOSIS — M6281 Muscle weakness (generalized): Secondary | ICD-10-CM

## 2023-11-04 NOTE — Therapy (Signed)
 OUTPATIENT PHYSICAL THERAPY TREATMENT   Patient Name: Cynthia Knapp MRN: [id number] DOB:1951-01-27, 73 y.o., female Today's Date: 11/04/2023  END OF SESSION:  PT End of Session - 11/04/23 1133     Visit Number 3    Number of Visits 17    Date for PT Re-Evaluation 01/19/24    Authorization Type MEDICARE PART B reporting period from 10/27/2023    Progress Note Due on Visit 10    PT Start Time 1037    PT Stop Time 1115    PT Time Calculation (min) 38 min    Activity Tolerance Patient tolerated treatment well    Behavior During Therapy WFL for tasks assessed/performed               Past Medical History:  Diagnosis Date   Hypertension    Past Surgical History:  Procedure Laterality Date   LEFT HEART CATH AND CORONARY ANGIOGRAPHY N/A 05/14/2021   Procedure: LEFT HEART CATH AND CORONARY ANGIOGRAPHY;  Surgeon: Cody Das, MD;  Location: [facility];  Service: Cardiovascular;  Laterality: N/A;   REPLACEMENT TOTAL KNEE Bilateral 2008   Patient Active Problem List   Diagnosis Date Noted   Angina pectoris (HCC) 05/13/2021    PCP: Perley Bradley, MD  REFERRING PROVIDER: Rance Burrows, MD (Sports Medicine)  REFERRING DIAG:  left hip pain, SI pain, GT pain, weak glute med, hip stability  THERAPY DIAG:  Other low back pain  Muscle weakness (generalized)  Rationale for Evaluation and Treatment: Rehabilitation  ONSET DATE: February 2025  SUBJECTIVE:   PERTINENT HISTORY: Patient is a 73 y.o. female who presents to outpatient physical therapy with a referral for medical diagnosis left hip pain, SI pain, GT pain, weak glute med, hip stability. This patient's chief complaints consist of bilateral LE weakness (L > R), low back pain, leading to the following functional deficits: limiting her function "to really get out and be a normal person," keep up with the grandkids, go on a trip to somewhere like [employer], stairs, gardening, bending. Relevant past  medical history and comorbidities include the following: she has detatched retina, Angina pectoris (HCC), palpitations, shortness of breath, gout, GERD, Diabetes Mellitis, right trigger finger, obesity, hypercholesteremia, atherosclerosis of aorta, atherosclerotic heart disease of native coronary artery with other forms of angina pectoris, major depressive disorder in full remission, she  has a past medical history of Hypertension. she  has a past surgical history that includes Replacement total knee (Bilateral, 2008) and LEFT HEART CATH AND CORONARY ANGIOGRAPHY (N/A, 05/14/2021). Patient denies hx of stroke, seizures, lung problems, unexplained weight loss, unexplained changes in bowel or bladder problems, unexplained stumbling or dropping things, osteoporosis, and spinal surgery  Exercise history: She walks her dogs 3 times a day, using a walking stick walking around her 30 acre property. She thinks it is 5-6 miles a day of walking a day. She has been doing this for the last 2 weeks. Before that was every other day. Recently it has been warmer out and she had more free time. She has been walking 4-5 years. She is active, doing planting, gardening, riding lawn mowers, walking 5-6 miles a day without pain. She dreads going up and down stairs.   SUBJECTIVE STATEMENT: Patient states she has no pain today and has not had any racing heart rate since last PT session. She felt stiff the day after last PT session but she continued to do her HEP. She feels like it is easier to get up  from the floor now. She still feels wobbly after doing the knee to stand exercise.   PAIN: NPRS: Current: 0/10 Best: 2/10, Worst: 8/10. Pain location: middle low back Pain description: achy, can be sharp sometimes when bending, stiffens sometimes Aggravating factors: bending, when going up and down stairs, walking up inclines on her property, too many twists in the swimming pool Relieving factors: hot shower, magnesium oil, heating  pad, Voltaren gel,   PRECAUTIONS: None    PATIENT GOALS: "basically to maintain balance and get strength so I can go up and down stairs like a normal person"   NEXT MD VISIT: Next month  OBJECTIVE                                                                                                                              TREATMENT  Therapeutic exercise: therapeutic exercises that incorporate ONE parameter at one or more areas of the body to centralize symptoms, develop strength and endurance, range of motion, and flexibility required for successful completion of functional activities.  NuStep using bilateral upper and lower extremities. For improved extremity mobility, muscular endurance, and activity tolerance; and to induce the analgesic effect of aerobic exercise, stimulate improved joint nutrition, and prepare body structures and systems for following interventions. Also to reinforce understanding of appropriate exercise intensity to help meet physical activity guidelines for health.  Seat/handle setting: 6/8 5:43  minutes Level: 4 Target SPM: > 90 Average SPM: 90 RPE: 5/10  Hip hikes with U UE support 1x20 each side off of 2x4 board Excellent form and carry over  Therapeutic activities: dynamic therapeutic activities incorporating MULTIPLE parameters or areas of the body designed to achieve improved functional performance.  Vitals measurement prior to tall kneel exercise 108/75 mmHg, 75 BPM standing   Tall kneel <> stand with knees on airex pad and UE support on chair 1x5 each side Effortful Added to HEP  Split squat with U UE support and airex pad under bottom knee.  1x10 R side forwards 1x6 L side forwards Discontinued due to L knee pain and lightheadedness Increased labor of breathing   Vitals measurement seated after feeling lightheaded:  135/76 mmHg, HR 75 bpm, 97%  Split squat with U UE support and airex pad under bottom knee.  1x5 L side forwards 1x10 R   forwards (with single yoga block under knee instead of airex pad) 1x10 R  forwards (with single yoga block under knee instead of airex pad)  Breathing rest between sets Added to HEP with suggestion for 1-2 yoga blocks to improve ability to perform with less UE support and knee pain.   Pt required multimodal cuing for proper technique and to facilitate improved neuromuscular control, strength, range of motion, and functional ability resulting in improved performance and form.    PATIENT EDUCATION:  Education details:  Exercise purpose/form. Self management techniques. Education on HEP including handout. Person educated: Patient Education method: Explanation Education comprehension: verbalized understanding  and needs further education  HOME EXERCISE PROGRAM: Access Code: [email or web address] URL: [email or web address] Date: 10/29/2023 Prepared by: Alleen Isle  Exercises - Hip Hikes off step  - 1 x daily - 3 sets - 20 reps  HOME EXERCISE PROGRAM [[id number]] View at [email or web address] code [email or web address] Standing to tall kneeling and back with chair support -  Repeat 10 Repetitions, Complete 3 Sets, Perform 3 Times a Week  HOME EXERCISE PROGRAM [[id number]] View at [email or web address] code [id number] Split squat over yoga block -  Repeat 10 Repetitions, Complete 2 Sets, Perform 3 Times a Week   ASSESSMENT:  CLINICAL IMPRESSION: Patient arrives with good overall tolerance to last PT session and HEP and improved lightheadedness and functional strength/endurance with tall kneeling transfer practice and split squats. Vitals measured before and after lightheadedness with split squat and blood pressure had an appropriate response (as did HR and SpO2). Patient seemed to recover well and was able to do more work before feeling lightheaded. She may respond well to gradual increase in difficulty to improve tolerance over time. Will continue to monitor.  Patient would benefit from continued management of  limiting condition by skilled physical therapist to address remaining impairments and functional limitations to work towards stated goals and return to PLOF or maximal functional independence.   From initial PT evaluation on 10/27/2023:  Patient is a 73 y.o. female referred to outpatient physical therapy with a medical diagnosis of left hip pain, SI pain, GT pain, weak glute med, hip stability who presents with signs and symptoms consistent with chronic low back pain, left hip weakness, especially in glute med in setting of decreased LE muscle performance and unsteadiness on feet. Patient's main deficits seems to be related to left hip weakness which is also affecting her L single leg stance during stair climbing and ambulation over uneven ground. She also has chronic low back pain but no signs of nerve root irritation at this time. Her MMT for knee quads is normal, but this test does not test all aspects of muscle strength/performance needed for functional activities such as stair climbing and she has sensation of weakness during stepping up with the left leg on stair climb. She Patient presents with significant pain, ROM, joint stiffness, knowledge, muscle performance (strength/power/endurance), balance, and activity tolerance impairments that are limiting ability "to really get out and be a normal person," keep up with the grandkids, go on a trip to somewhere like [employer], stairs, gardening, bending without difficulty. Patient will benefit from skilled physical therapy intervention to address current body structure impairments and activity limitations to improve function and work towards goals set in current POC in order to return to prior level of function or maximal functional improvement.    OBJECTIVE IMPAIRMENTS: decreased activity tolerance, decreased balance, decreased endurance, decreased knowledge of condition, decreased mobility, difficulty walking, decreased ROM, decreased strength, increased  muscle spasms, and pain.   ACTIVITY LIMITATIONS: carrying, lifting, bending, squatting, stairs, transfers, locomotion level, and caring for others  PARTICIPATION LIMITATIONS: interpersonal relationship, community activity, yard work, and   limiting her function "to really get out and be a normal person," keep up with the grandkids, go on a trip to somewhere like [employer], stairs, gardening, bending  PERSONAL FACTORS: Age, Past/current experiences, Time since onset of injury/illness/exacerbation, and 3+ comorbidities:   detatched retina, Angina pectoris (HCC), palpitations, shortness of breath, gout, GERD, Diabetes Mellitis, right trigger finger, obesity, hypercholesteremia, atherosclerosis of aorta, atherosclerotic heart disease of native coronary artery with other forms of angina pectoris,  major depressive disorder in full remission, she  has a past medical history of Hypertension. she  has a past surgical history that includes Replacement total knee (Bilateral, 2008) and LEFT HEART CATH AND CORONARY ANGIOGRAPHY (N/A, 05/14/2021) are also affecting patient's functional outcome.   REHAB POTENTIAL: Good  CLINICAL DECISION MAKING: Evolving/moderate complexity  EVALUATION COMPLEXITY: Moderate   GOALS: Goals reviewed with patient? No  SHORT TERM GOALS: Target date: 11/10/2023  Patient will be independent with initial home exercise program for self-management of symptoms. Baseline: Initial HEP to be provided at visit 2 as appropriate (10/27/23); Goal status: In progress   LONG TERM GOALS: Target date: 01/19/2024  Patient will be independent with a long-term home exercise program for self-management of symptoms.  Baseline: Initial HEP to be provided at visit 2 as appropriate (10/27/23); Goal status: In progress  2.  Patient will demonstrate improvement in Patient Specific Functional Scale (PSFS) of equal or greater than 8/10 points to reflect clinically significant improvement in patient's  most valued functional activities. Baseline: to be measured at visit 2 as appropriate (10/27/23); 5.5/10 (10/29/2023);  Goal status: In progress  3.  Patient will demonstrate improvement in B hip strength of equal or greater than 1/2 MMT above baseline or at least 4+/5 to help her be able to navigate stairs with less unsteadiness and more success.  Baseline: 3-4+/5 (10/27/23); Goal status: In progress  4.  Patient will demonstrate the ability to confidently ascend/descend 16 steps with step over step gait pattern, no UE support, no perceived imbalance or increased pain beyond 1/10 to improve her ability to climb stairs at home and in the community.  Baseline: Ascent/Descent five 8-inch steps: descended with U UE support (touchdown) step over step. Ascended step over step with UE touchdown support. Slow stepping up both sides leading. Pain at right SIJ region with R step up and weakness over left lateral quad width left step up.  (10/27/23); Goal status: In progress  5.  Patient will report NPRS equal or less than 3/10 during functional activities during the last 2 weeks to improve their abilitly to complete community, work and/or recreational activities with less limitation. Baseline: 8/10 (10/27/23); Goal status: In progress   PLAN:  PT FREQUENCY: 1-2x/week  PT DURATION: 8-12 weeks  PLANNED INTERVENTIONS: 97164- PT Re-evaluation, 97750- Physical Performance Testing, 97110-Therapeutic exercises, 97530- Therapeutic activity, [id number]- Neuromuscular re-education, 97535- Self Care, 16109- Manual therapy, [phone]- Gait training, [phone]- Electrical stimulation (unattended), Patient/Family education, Balance training, Stair training, Dry Needling, Joint mobilization, Spinal mobilization, Cryotherapy, and Moist heat  PLAN FOR NEXT SESSION: update HEP as appropriate, progressive LE/functional/core strengthening/balance/ROM. Education. Manual therapy as needed.    Carilyn Charles. Artemio Larry, PT, DPT 11/04/23, 11:44  AM  [facility] [address] [place], [place] [address] P: [phone] I F: [phone]

## 2023-11-16 ENCOUNTER — Ambulatory Visit: Admitting: Physical Therapy

## 2023-11-16 ENCOUNTER — Encounter: Payer: Self-pay | Admitting: Physical Therapy

## 2023-11-16 DIAGNOSIS — M5459 Other low back pain: Secondary | ICD-10-CM

## 2023-11-16 DIAGNOSIS — M6281 Muscle weakness (generalized): Secondary | ICD-10-CM

## 2023-11-16 NOTE — Therapy (Unsigned)
 OUTPATIENT PHYSICAL THERAPY TREATMENT   Patient Name: Cynthia Knapp MRN: [id number] DOB:Mar 26, 1951, 73 y.o., female Today's Date: 11/16/2023  END OF SESSION:      Past Medical History:  Diagnosis Date   Hypertension    Past Surgical History:  Procedure Laterality Date   LEFT HEART CATH AND CORONARY ANGIOGRAPHY N/A 05/14/2021   Procedure: LEFT HEART CATH AND CORONARY ANGIOGRAPHY;  Surgeon: Cody Das, MD;  Location: [facility];  Service: Cardiovascular;  Laterality: N/A;   REPLACEMENT TOTAL KNEE Bilateral 2008   Patient Active Problem List   Diagnosis Date Noted   Angina pectoris (HCC) 05/13/2021    PCP: Perley Bradley, MD  REFERRING PROVIDER: Rance Burrows, MD (Sports Medicine)  REFERRING DIAG:  left hip pain, SI pain, GT pain, weak glute med, hip stability  THERAPY DIAG:  No diagnosis found.  Rationale for Evaluation and Treatment: Rehabilitation  ONSET DATE: February 2025  SUBJECTIVE:   PERTINENT HISTORY: Patient is a 72 y.o. female who presents to outpatient physical therapy with a referral for medical diagnosis left hip pain, SI pain, GT pain, weak glute med, hip stability. This patient's chief complaints consist of bilateral LE weakness (L > R), low back pain, leading to the following functional deficits: limiting her function "to really get out and be a normal person," keep up with the grandkids, go on a trip to somewhere like [employer], stairs, gardening, bending. Relevant past medical history and comorbidities include the following: she has detatched retina, Angina pectoris (HCC), palpitations, shortness of breath, gout, GERD, Diabetes Mellitis, right trigger finger, obesity, hypercholesteremia, atherosclerosis of aorta, atherosclerotic heart disease of native coronary artery with other forms of angina pectoris, major depressive disorder in full remission, she  has a past medical history of Hypertension. she  has a past surgical history that  includes Replacement total knee (Bilateral, 2008) and LEFT HEART CATH AND CORONARY ANGIOGRAPHY (N/A, 05/14/2021). Patient denies hx of stroke, seizures, lung problems, unexplained weight loss, unexplained changes in bowel or bladder problems, unexplained stumbling or dropping things, osteoporosis, and spinal surgery  Exercise history: She walks her dogs 3 times a day, using a walking stick walking around her [place] acre property. She thinks it is 5-6 miles a day of walking a day. She has been doing this for the last 2 weeks. Before that was every other day. Recently it has been warmer out and she had more free time. She has been walking 4-5 years. She is active, doing planting, gardening, riding lawn mowers, walking 5-6 miles a day without pain. She dreads going up and down stairs.   SUBJECTIVE STATEMENT: Patient states she did her HEP 4 times since last PT session and one day she did them twice. She has a knott in her left lower back by her pelvis. She was going to vacuum and found she could not bend forwards so she stopped. She states she took one meloxicam this morning because she wanted to be able to move for PT. 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 She had a really relaxing trip last week, but she is noticing tripping when picking up her foot to go up a step. She states she has been having more problems with her back recently.   PAIN: NPRS: Current: 4/10 Best: 2/10, Worst: 8/10. Pain location: middle low back Pain description: achy, can be sharp sometimes when bending, stiffens sometimes  Aggravating factors: bending, when going up and down stairs, walking up inclines on her property, too many twists in the swimming pool Relieving factors: hot shower, magnesium oil, heating pad, Voltaren gel,   PRECAUTIONS: None    PATIENT GOALS: "basically to maintain balance and get strength so I can go up and down  stairs like a normal person"   NEXT MD VISIT: Next month  OBJECTIVE                                                                                                                              TREATMENT  Therapeutic exercise: therapeutic exercises that incorporate ONE parameter at one or more areas of the body to centralize symptoms, develop strength and endurance, range of motion, and flexibility required for successful completion of functional activities.  Forward flexion ROM: fingers to ankles, minimal lumbar spine flexion and walked hands back up.   Standing repeated lumbar extension with plinth support at pelvis 1x10 Feels good to stretch during, feels tighter afterwards, no change with lumbar flexion  Seated lumbar flexion ball roll out 1x2 min forwards, self selected pace Feels good during, feels better after, and improved flexion ROM and comfort following  1x2 min diagonals and forwards as tolerated  Seated lumbar flexion without ball 1x3 reps to learn how to do it at home Added to HEP  Hooklying double knees to chest with hands behind knees 1x10 Added to HEP  Hooklying lower trunk rotation  1x10 each way Added to HEP  Neuromuscular Re-education: a technique or exercise performed with the goal of improving the level of communication between the body and the brain, such as for balance, motor control, muscle activation patterns, coordination, desensitization, quality of muscle contraction, proprioception, and/or kinesthetic sense needed for successful and safe completion of functional activities.   Hooklying diaphragmatic breathing ~4 minutes  Seated diaphragmatic breathing ~3 minutes hands supported  NuStep using bilateral upper and lower extremities. For improved extremity mobility, muscular endurance, and activity tolerance; and to induce the analgesic effect of aerobic exercise, stimulate improved joint nutrition, and prepare body structures and systems for following  interventions. Also to reinforce understanding of appropriate exercise intensity to help meet physical activity guidelines for health.  Seat/handle setting: 6/8 5:43  minutes Level: 4 Target SPM: > 90 Average SPM: 90 RPE: 5/10  Hip hikes with U UE support 1x20 each side off of 2x4 board Excellent form and carry over  Therapeutic activities: dynamic therapeutic activities incorporating MULTIPLE parameters or areas of the body designed to achieve improved functional performance.  Tall kneel <> stand with knees on airex pad and UE support on chair 1x5 each side Effortful Added to HEP  Split squat with U UE support and airex pad under bottom knee.  1x10 R side forwards 1x6 L side forwards Discontinued due to L knee pain and lightheadedness Increased labor of breathing   Split squat with U UE support and airex pad under bottom  knee.  1x5 L side forwards 1x10 R  forwards (with single yoga block under knee instead of airex pad) 1x10 R  forwards (with single yoga block under knee instead of airex pad)  Breathing rest between sets Added to HEP with suggestion for 1-2 yoga blocks to improve ability to perform with less UE support and knee pain.   Pt required multimodal cuing for proper technique and to facilitate improved neuromuscular control, strength, range of motion, and functional ability resulting in improved performance and form.    PATIENT EDUCATION:  Education details:  Exercise purpose/form. Self management techniques. Education on HEP including handout. Person educated: Patient Education method: Explanation Education comprehension: verbalized understanding and needs further education  HOME EXERCISE PROGRAM: Access Code: [email or web address] URL: [email or web address] Date: 11/16/2023 Prepared by: Alleen Isle  Exercises - Hip Hikes off step  - 1 x daily - 3 sets - 20 reps - Supine Double Knee to Chest  - 1-2 x daily - 2 sets - 10 reps - 5 seconds hold - Seated  Lumbar Flexion Stretch  - 1-2 x daily - 1-2 sets - 10 reps - 5 seconds hold - Supine Lower Trunk Rotation  - 2 x daily - 1 sets - 20 reps - 1-5 seconds hold - Seated Diaphragmatic Breathing  - 2 x daily - 5 minute practice time  HOME EXERCISE PROGRAM [[id number]] View at [email or web address] code [email or web address] Standing to tall kneeling and back with chair support -  Repeat 10 Repetitions, Complete 3 Sets, Perform 3 Times a Week  HOME EXERCISE PROGRAM [[id number]] View at [email or web address] code [id number] Split squat over yoga block -  Repeat 10 Repetitions, Complete 2 Sets, Perform 3 Times a Week     ASSESSMENT:  CLINICAL IMPRESSION: Patient arrives with good overall tolerance to last PT session and HEP and improved lightheadedness and functional strength/endurance with tall kneeling transfer practice and split squats. Vitals measured before and after lightheadedness with split squat and blood pressure had an appropriate response (as did HR and SpO2). Patient seemed to recover well and was able to do more work before feeling lightheaded. She may respond well to gradual increase in difficulty to improve tolerance over time. Will continue to monitor.  Patient would benefit from continued management of limiting condition by skilled physical therapist to address remaining impairments and functional limitations to work towards stated goals and return to PLOF or maximal functional independence.   From initial PT evaluation on 10/27/2023:  Patient is a 73 y.o. female referred to outpatient physical therapy with a medical diagnosis of left hip pain, SI pain, GT pain, weak glute med, hip stability who presents with signs and symptoms consistent with chronic low back pain, left hip weakness, especially in glute med in setting of decreased LE muscle performance and unsteadiness on feet. Patient's main deficits seems to be related to left hip weakness which is also affecting her L single leg stance during stair climbing  and ambulation over uneven ground. She also has chronic low back pain but no signs of nerve root irritation at this time. Her MMT for knee quads is normal, but this test does not test all aspects of muscle strength/performance needed for functional activities such as stair climbing and she has sensation of weakness during stepping up with the left leg on stair climb. She Patient presents with significant pain, ROM, joint stiffness, knowledge, muscle performance (strength/power/endurance), balance, and activity tolerance impairments that are limiting ability "to really get out and be a normal person," keep up  with the grandkids, go on a trip to somewhere like [employer], stairs, gardening, bending without difficulty. Patient will benefit from skilled physical therapy intervention to address current body structure impairments and activity limitations to improve function and work towards goals set in current POC in order to return to prior level of function or maximal functional improvement.    OBJECTIVE IMPAIRMENTS: decreased activity tolerance, decreased balance, decreased endurance, decreased knowledge of condition, decreased mobility, difficulty walking, decreased ROM, decreased strength, increased muscle spasms, and pain.   ACTIVITY LIMITATIONS: carrying, lifting, bending, squatting, stairs, transfers, locomotion level, and caring for others  PARTICIPATION LIMITATIONS: interpersonal relationship, community activity, yard work, and  limiting her function "to really get out and be a normal person," keep up with the grandkids, go on a trip to somewhere like [employer], stairs, gardening, bending  PERSONAL FACTORS: Age, Past/current experiences, Time since onset of injury/illness/exacerbation, and 3+ comorbidities:  detatched retina, Angina pectoris (HCC), palpitations, shortness of breath, gout, GERD, Diabetes Mellitis, right trigger finger, obesity, hypercholesteremia, atherosclerosis of aorta,  atherosclerotic heart disease of native coronary artery with other forms of angina pectoris, major depressive disorder in full remission, she  has a past medical history of Hypertension. she  has a past surgical history that includes Replacement total knee (Bilateral, 2008) and LEFT HEART CATH AND CORONARY ANGIOGRAPHY (N/A, 05/14/2021) are also affecting patient's functional outcome.   REHAB POTENTIAL: Good  CLINICAL DECISION MAKING: Evolving/moderate complexity  EVALUATION COMPLEXITY: Moderate   GOALS: Goals reviewed with patient? No  SHORT TERM GOALS: Target date: 11/10/2023  Patient will be independent with initial home exercise program for self-management of symptoms. Baseline: Initial HEP to be provided at visit 2 as appropriate (10/27/23); Goal status: In progress   LONG TERM GOALS: Target date: 01/19/2024  Patient will be independent with a long-term home exercise program for self-management of symptoms.  Baseline: Initial HEP to be provided at visit 2 as appropriate (10/27/23); Goal status: In progress  2.  Patient will demonstrate improvement in Patient Specific Functional Scale (PSFS) of equal or greater than 8/10 points to reflect clinically significant improvement in patient's most valued functional activities. Baseline: to be measured at visit 2 as appropriate (10/27/23); 5.5/10 (10/29/2023);  Goal status: In progress  3.  Patient will demonstrate improvement in B hip strength of equal or greater than 1/2 MMT above baseline or at least 4+/5 to help her be able to navigate stairs with less unsteadiness and more success.  Baseline: 3-4+/5 (10/27/23); Goal status: In progress  4.  Patient will demonstrate the ability to confidently ascend/descend 16 steps with step over step gait pattern, no UE support, no perceived imbalance or increased pain beyond 1/10 to improve her ability to climb stairs at home and in the community.  Baseline: Ascent/Descent five 8-inch steps: descended  with U UE support (touchdown) step over step. Ascended step over step with UE touchdown support. Slow stepping up both sides leading. Pain at right SIJ region with R step up and weakness over left lateral quad width left step up.  (10/27/23); Goal status: In progress  5.  Patient will report NPRS equal or less than 3/10 during functional activities during the last 2 weeks to improve their abilitly to complete community, work and/or recreational activities with less limitation. Baseline: 8/10 (10/27/23); Goal status: In progress   PLAN:  PT FREQUENCY: 1-2x/week  PT DURATION: 8-12 weeks  PLANNED INTERVENTIONS: 97164- PT Re-evaluation, 97750- Physical Performance Testing, 97110-Therapeutic exercises, 97530- Therapeutic activity, [id number]- Neuromuscular re-education,  60454- Self Care, [id number]- Manual therapy, [id number]- Gait training, [phone]- Electrical stimulation (unattended), Patient/Family education, Balance training, Stair training, Dry Needling, Joint mobilization, Spinal mobilization, Cryotherapy, and Moist heat  PLAN FOR NEXT SESSION: update HEP as appropriate, progressive LE/functional/core strengthening/balance/ROM. Education. Manual therapy as needed.    Carilyn Charles. Artemio Larry, PT, DPT 11/16/23, 9:00 AM  [facility] [address] [place], [place] [address] P: [phone] I F: [phone]

## 2023-11-16 NOTE — Therapy (Signed)
 OUTPATIENT PHYSICAL THERAPY TREATMENT   Patient Name: Cynthia Knapp MRN: [id number] DOB:[birth year], 73 y.o., female Today's Date: 11/16/2023  END OF SESSION:  PT End of Session - 11/16/23 1951     Visit Number 4    Number of Visits 17    Date for PT Re-Evaluation 01/19/24    Authorization Type MEDICARE PART B reporting period from 10/27/2023    Progress Note Due on Visit 10    PT Start Time 0900    PT Stop Time 0945    PT Time Calculation (min) 45 min    Activity Tolerance Patient tolerated treatment well    Behavior During Therapy [facility] for tasks assessed/performed                Past Medical History:  Diagnosis Date   Hypertension    Past Surgical History:  Procedure Laterality Date   LEFT HEART CATH AND CORONARY ANGIOGRAPHY N/A 05/14/2021   Procedure: LEFT HEART CATH AND CORONARY ANGIOGRAPHY;  Surgeon: Cody Das, MD;  Location: [facility];  Service: Cardiovascular;  Laterality: N/A;   REPLACEMENT TOTAL KNEE Bilateral 2008   Patient Active Problem List   Diagnosis Date Noted   Angina pectoris (HCC) 05/13/2021    PCP: Perley Bradley, MD  REFERRING PROVIDER: Rance Burrows, MD (Sports Medicine)  REFERRING DIAG:  left hip pain, SI pain, GT pain, weak glute med, hip stability  THERAPY DIAG:  Other low back pain  Muscle weakness (generalized)  Rationale for Evaluation and Treatment: Rehabilitation  ONSET DATE: February 2025  SUBJECTIVE:   PERTINENT HISTORY: Patient is a 73 y.o. female who presents to outpatient physical therapy with a referral for medical diagnosis left hip pain, SI pain, GT pain, weak glute med, hip stability. This patient's chief complaints consist of bilateral LE weakness (L > R), low back pain, leading to the following functional deficits: limiting her function "to really get out and be a normal person," keep up with the grandkids, go on a trip to somewhere like [employer], stairs, gardening, bending. Relevant past  medical history and comorbidities include the following: she has detatched retina, Angina pectoris (HCC), palpitations, shortness of breath, gout, GERD, Diabetes Mellitis, right trigger finger, obesity, hypercholesteremia, atherosclerosis of aorta, atherosclerotic heart disease of native coronary artery with other forms of angina pectoris, major depressive disorder in full remission, she  has a past medical history of Hypertension. she  has a past surgical history that includes Replacement total knee (Bilateral, 2008) and LEFT HEART CATH AND CORONARY ANGIOGRAPHY (N/A, 05/14/2021). Patient denies hx of stroke, seizures, lung problems, unexplained weight loss, unexplained changes in bowel or bladder problems, unexplained stumbling or dropping things, osteoporosis, and spinal surgery  Exercise history: She walks her dogs 3 times a day, using a walking stick walking around her [place] acre property. She thinks it is 5-6 miles a day of walking a day. She has been doing this for the last 2 weeks. Before that was every other day. Recently it has been warmer out and she had more free time. She has been walking 4-5 years. She is active, doing planting, gardening, riding lawn mowers, walking 5-6 miles a day without pain. She dreads going up and down stairs.   SUBJECTIVE STATEMENT: Patient states she did her HEP 4 times since last PT session and one day she did them twice. She has a knott in her left lower back by her pelvis. She was going to vacuum and found she could not bend forwards  so she stopped. She states she took one meloxicam this morning because she wanted to be able to move for PT. 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 She had a really relaxing trip last week, but she is noticing tripping when picking up her foot to go up a step. She states she has been having more problems with her back recently.   PAIN: NPRS: Current:  4/10 Best: 2/10, Worst: 8/10. Pain location: middle low back Pain description: achy, can be sharp sometimes when bending, stiffens sometimes Aggravating factors: bending, when going up and down stairs, walking up inclines on her property, too many twists in the swimming pool Relieving factors: hot shower, magnesium oil, heating pad, Voltaren gel,   PRECAUTIONS: None    PATIENT GOALS: "basically to maintain balance and get strength so I can go up and down stairs like a normal person"   NEXT MD VISIT: Next month  OBJECTIVE                                                                                                                              TREATMENT  Therapeutic exercise: therapeutic exercises that incorporate ONE parameter at one or more areas of the body to centralize symptoms, develop strength and endurance, range of motion, and flexibility required for successful completion of functional activities.  Forward flexion ROM: fingers to ankles, minimal lumbar spine flexion and walked hands back up.   Standing repeated lumbar extension with plinth support at pelvis 1x10 Feels good to stretch during, feels tighter afterwards, no change with lumbar flexion  Seated lumbar flexion ball roll out 1x2 min forwards, self selected pace Feels good during, feels better after, and improved flexion ROM and comfort following  1x2 min diagonals and forwards as tolerated  Seated lumbar flexion without ball 1x3 reps to learn how to do it at home Added to HEP  Hooklying double knees to chest with hands behind knees 1x10 Added to HEP  Hooklying lower trunk rotation  1x10 each way Added to HEP  Neuromuscular Re-education: a technique or exercise performed with the goal of improving the level of communication between the body and the brain, such as for balance, motor control, muscle activation patterns, coordination, desensitization, quality of muscle contraction, proprioception, and/or  kinesthetic sense needed for successful and safe completion of functional activities.   Hooklying diaphragmatic breathing ~4 minutes  Seated diaphragmatic breathing ~3 minutes hands supported ~ 2 minutes with one hand on chest and one hand on belly Added to HEP   Pt required multimodal cuing for proper technique and to facilitate improved neuromuscular control, strength, range of motion, and functional ability resulting in improved performance and form.    PATIENT EDUCATION:  Education details:  Exercise purpose/form. Self management techniques. Education on HEP including handout. Person educated: Patient Education method: Explanation Education comprehension: verbalized understanding  and needs further education  HOME EXERCISE PROGRAM: Access Code: [email or web address] URL: [email or web address] Date: 11/16/2023 Prepared by: Alleen Isle  Exercises - Hip Hikes off step  - 1 x daily - 3 sets - 20 reps - Supine Double Knee to Chest  - 1-2 x daily - 2 sets - 10 reps - 5 seconds hold - Seated Lumbar Flexion Stretch  - 1-2 x daily - 1-2 sets - 10 reps - 5 seconds hold - Supine Lower Trunk Rotation  - 2 x daily - 1 sets - 20 reps - 1-5 seconds hold - Seated Diaphragmatic Breathing  - 2 x daily - 5 minute practice time  HOME EXERCISE PROGRAM [[id number]] View at [email or web address] code [email or web address] Standing to tall kneeling and back with chair support -  Repeat 10 Repetitions, Complete 3 Sets, Perform 3 Times a Week  HOME EXERCISE PROGRAM [[id number]] View at [email or web address] code [id number] Split squat over yoga block -  Repeat 10 Repetitions, Complete 2 Sets, Perform 3 Times a Week     ASSESSMENT:  CLINICAL IMPRESSION: Patient with increased low back pain today, so spent more time exploring active options for pain control and functional core strengthening and motor control. Patient felt worse with repeated extension but better with repeated flexion and lumbar mobility exercises.  She demonstrated paradoxical breathing at first when attempting diaphragmatic breathing, but this improved with cuing and concentrated practice. She was then able to progress to seated position with good carry over. Plan to return to work on balance hand hip focus with inclusion of core and functional strengthening as appropriate next session. Patient would benefit from continued management of limiting condition by skilled physical therapist to address remaining impairments and functional limitations to work towards stated goals and return to PLOF or maximal functional independence.    From initial PT evaluation on 10/27/2023:  Patient is a 73 y.o. female referred to outpatient physical therapy with a medical diagnosis of left hip pain, SI pain, GT pain, weak glute med, hip stability who presents with signs and symptoms consistent with chronic low back pain, left hip weakness, especially in glute med in setting of decreased LE muscle performance and unsteadiness on feet. Patient's main deficits seems to be related to left hip weakness which is also affecting her L single leg stance during stair climbing and ambulation over uneven ground. She also has chronic low back pain but no signs of nerve root irritation at this time. Her MMT for knee quads is normal, but this test does not test all aspects of muscle strength/performance needed for functional activities such as stair climbing and she has sensation of weakness during stepping up with the left leg on stair climb. She Patient presents with significant pain, ROM, joint stiffness, knowledge, muscle performance (strength/power/endurance), balance, and activity tolerance impairments that are limiting ability "to really get out and be a normal person," keep up with the grandkids, go on a trip to somewhere like [employer], stairs, gardening, bending without difficulty. Patient will benefit from skilled physical therapy intervention to address current body structure  impairments and activity limitations to improve function and work towards goals set in current POC in order to return to prior level of function or maximal functional improvement.    OBJECTIVE IMPAIRMENTS: decreased activity tolerance, decreased balance, decreased endurance, decreased knowledge of condition, decreased mobility, difficulty walking, decreased ROM, decreased strength, increased muscle spasms, and pain.   ACTIVITY LIMITATIONS: carrying, lifting, bending, squatting, stairs, transfers, locomotion level, and caring for others  PARTICIPATION LIMITATIONS:  interpersonal relationship, community activity, yard work, and  limiting her function "to really get out and be a normal person," keep up with the grandkids, go on a trip to somewhere like [employer], stairs, gardening, bending  PERSONAL FACTORS: Age, Past/current experiences, Time since onset of injury/illness/exacerbation, and 3+ comorbidities:  detatched retina, Angina pectoris (HCC), palpitations, shortness of breath, gout, GERD, Diabetes Mellitis, right trigger finger, obesity, hypercholesteremia, atherosclerosis of aorta, atherosclerotic heart disease of native coronary artery with other forms of angina pectoris, major depressive disorder in full remission, she  has a past medical history of Hypertension. she  has a past surgical history that includes Replacement total knee (Bilateral, 2008) and LEFT HEART CATH AND CORONARY ANGIOGRAPHY (N/A, 05/14/2021) are also affecting patient's functional outcome.   REHAB POTENTIAL: Good  CLINICAL DECISION MAKING: Evolving/moderate complexity  EVALUATION COMPLEXITY: Moderate   GOALS: Goals reviewed with patient? No  SHORT TERM GOALS: Target date: 11/10/2023  Patient will be independent with initial home exercise program for self-management of symptoms. Baseline: Initial HEP to be provided at visit 2 as appropriate (10/27/23); Goal status: In progress   LONG TERM GOALS: Target date:  01/19/2024  Patient will be independent with a long-term home exercise program for self-management of symptoms.  Baseline: Initial HEP to be provided at visit 2 as appropriate (10/27/23); Goal status: In progress  2.  Patient will demonstrate improvement in Patient Specific Functional Scale (PSFS) of equal or greater than 8/10 points to reflect clinically significant improvement in patient's most valued functional activities. Baseline: to be measured at visit 2 as appropriate (10/27/23); 5.5/10 (10/29/2023);  Goal status: In progress  3.  Patient will demonstrate improvement in B hip strength of equal or greater than 1/2 MMT above baseline or at least 4+/5 to help her be able to navigate stairs with less unsteadiness and more success.  Baseline: 3-4+/5 (10/27/23); Goal status: In progress  4.  Patient will demonstrate the ability to confidently ascend/descend 16 steps with step over step gait pattern, no UE support, no perceived imbalance or increased pain beyond 1/10 to improve her ability to climb stairs at home and in the community.  Baseline: Ascent/Descent five 8-inch steps: descended with U UE support (touchdown) step over step. Ascended step over step with UE touchdown support. Slow stepping up both sides leading. Pain at right SIJ region with R step up and weakness over left lateral quad width left step up.  (10/27/23); Goal status: In progress  5.  Patient will report NPRS equal or less than 3/10 during functional activities during the last 2 weeks to improve their abilitly to complete community, work and/or recreational activities with less limitation. Baseline: 8/10 (10/27/23); Goal status: In progress   PLAN:  PT FREQUENCY: 1-2x/week  PT DURATION: 8-12 weeks  PLANNED INTERVENTIONS: 97164- PT Re-evaluation, 97750- Physical Performance Testing, 97110-Therapeutic exercises, 97530- Therapeutic activity, [id number]- Neuromuscular re-education, 97535- Self Care, 54098- Manual therapy,  [phone]- Gait training, [phone]- Electrical stimulation (unattended), Patient/Family education, Balance training, Stair training, Dry Needling, Joint mobilization, Spinal mobilization, Cryotherapy, and Moist heat  PLAN FOR NEXT SESSION: update HEP as appropriate, progressive LE/functional/core strengthening/balance/ROM. Education. Manual therapy as needed.    Carilyn Charles. Artemio Larry, PT, DPT 11/16/23, 7:55 PM  [facility] [address] [place], [place] [address] P: [phone] I F: [phone]

## 2023-11-19 ENCOUNTER — Ambulatory Visit: Admitting: Physical Therapy

## 2023-11-25 ENCOUNTER — Ambulatory Visit: Admitting: Physical Therapy

## 2023-12-01 ENCOUNTER — Ambulatory Visit: Admitting: Physical Therapy

## 2023-12-03 ENCOUNTER — Ambulatory Visit: Admitting: Physical Therapy

## 2023-12-07 ENCOUNTER — Ambulatory Visit: Admitting: Physical Therapy

## 2023-12-09 ENCOUNTER — Encounter: Admitting: Physical Therapy

## 2023-12-14 ENCOUNTER — Encounter: Admitting: Physical Therapy

## 2023-12-16 ENCOUNTER — Encounter: Admitting: Physical Therapy

## 2023-12-21 ENCOUNTER — Encounter: Admitting: Physical Therapy

## 2023-12-23 ENCOUNTER — Encounter: Admitting: Physical Therapy

## 2023-12-24 ENCOUNTER — Ambulatory Visit (INDEPENDENT_AMBULATORY_CARE_PROVIDER_SITE_OTHER): Admitting: Podiatry

## 2023-12-24 DIAGNOSIS — F419 Anxiety disorder, unspecified: Secondary | ICD-10-CM | POA: Insufficient documentation

## 2023-12-24 DIAGNOSIS — K219 Gastro-esophageal reflux disease without esophagitis: Secondary | ICD-10-CM | POA: Insufficient documentation

## 2023-12-24 DIAGNOSIS — H35039 Hypertensive retinopathy, unspecified eye: Secondary | ICD-10-CM | POA: Insufficient documentation

## 2023-12-24 DIAGNOSIS — M5441 Lumbago with sciatica, right side: Secondary | ICD-10-CM | POA: Insufficient documentation

## 2023-12-24 DIAGNOSIS — F325 Major depressive disorder, single episode, in full remission: Secondary | ICD-10-CM | POA: Insufficient documentation

## 2023-12-24 DIAGNOSIS — M1A9XX0 Chronic gout, unspecified, without tophus (tophi): Secondary | ICD-10-CM | POA: Insufficient documentation

## 2023-12-24 DIAGNOSIS — E7800 Pure hypercholesterolemia, unspecified: Secondary | ICD-10-CM | POA: Insufficient documentation

## 2023-12-24 DIAGNOSIS — M7751 Other enthesopathy of right foot: Secondary | ICD-10-CM | POA: Diagnosis not present

## 2023-12-24 DIAGNOSIS — I10 Essential (primary) hypertension: Secondary | ICD-10-CM | POA: Insufficient documentation

## 2023-12-24 DIAGNOSIS — L600 Ingrowing nail: Secondary | ICD-10-CM | POA: Diagnosis not present

## 2023-12-24 DIAGNOSIS — M179 Osteoarthritis of knee, unspecified: Secondary | ICD-10-CM | POA: Insufficient documentation

## 2023-12-24 DIAGNOSIS — M5430 Sciatica, unspecified side: Secondary | ICD-10-CM | POA: Insufficient documentation

## 2023-12-24 DIAGNOSIS — I69391 Dysphagia following cerebral infarction: Secondary | ICD-10-CM | POA: Insufficient documentation

## 2023-12-24 DIAGNOSIS — E119 Type 2 diabetes mellitus without complications: Secondary | ICD-10-CM | POA: Insufficient documentation

## 2023-12-24 DIAGNOSIS — I700 Atherosclerosis of aorta: Secondary | ICD-10-CM | POA: Insufficient documentation

## 2023-12-24 DIAGNOSIS — E66812 Obesity, class 2: Secondary | ICD-10-CM | POA: Insufficient documentation

## 2023-12-24 DIAGNOSIS — E559 Vitamin D deficiency, unspecified: Secondary | ICD-10-CM | POA: Insufficient documentation

## 2023-12-24 NOTE — Progress Notes (Signed)
 Subjective:  Patient ID: Cynthia Knapp, female    DOB: 09-05-1950,  MRN: [id number]  Chief Complaint  Patient presents with   Ingrown Toenail    Left hallux ingrown nail     73 y.o. female presents with the above complaint.  Patient presents with left hallux lateral border ingrown painful to touch is progressing and worse worse with ambulation for pressure patient would like to discuss treatment options for has not seen as prior to seeing me.  She has secondary complaint right second metatarsophalangeal joint pain has been hurting.  She.   Review of Systems: Negative except as noted in the HPI. Denies N/V/F/Ch.  Past Medical History:  Diagnosis Date   Hypertension     Current Outpatient Medications:    aspirin  81 MG chewable tablet, CHEW 1 TABLET BY MOUTH DAILY., Disp: , Rfl:    acetaminophen  (TYLENOL ) 650 MG CR tablet, Take 1,300 mg by mouth 2 (two) times daily as needed for pain., Disp: , Rfl:    atorvastatin (LIPITOR) 20 MG tablet, Take 20 mg by mouth at bedtime., Disp: , Rfl:    ezetimibe  (ZETIA ) 10 MG tablet, Take 1 tablet (10 mg total) by mouth daily after supper., Disp: 90 tablet, Rfl: 3   famotidine  (PEPCID ) 40 MG tablet, TAKE 1 TABLET BY MOUTH EVERY DAY, Disp: 90 tablet, Rfl: 1   FLUoxetine (PROZAC) 20 MG capsule, Take 20 mg by mouth at bedtime. (Patient not taking: Reported on 10/27/2023), Disp: , Rfl:    hydrochlorothiazide (HYDRODIURIL) 25 MG tablet, Take 25 mg by mouth daily., Disp: , Rfl:    isosorbide  dinitrate (ISORDIL ) 30 MG tablet, TAKE 1 TABLET BY MOUTH 2 TIMES DAILY., Disp: 180 tablet, Rfl: 2   meloxicam (MOBIC) 7.5 MG tablet, Take 7.5 mg by mouth daily., Disp: , Rfl:    metFORMIN (GLUCOPHAGE) 500 MG tablet, Take 1,000 mg by mouth 2 (two) times daily with a meal., Disp: , Rfl:    metoprolol succinate (TOPROL-XL) 100 MG 24 hr tablet, Take 100 mg by mouth every evening. Take with or immediately following a meal., Disp: , Rfl:    nitroGLYCERIN  (NITROSTAT ) 0.4 MG SL  tablet, Place 1 tablet (0.4 mg total) under the tongue every 5 (five) minutes as needed for up to 25 days for chest pain., Disp: 25 tablet, Rfl: 3   Omega-3 Fatty Acids (FISH OIL PO), Take 1,250 mg by mouth daily., Disp: , Rfl:    pantoprazole (PROTONIX) 20 MG tablet, Take 20 mg by mouth daily., Disp: , Rfl:    pantoprazole (PROTONIX) 40 MG tablet, Take 40 mg by mouth daily as needed (GERD). (Patient not taking: Reported on 10/27/2023), Disp: , Rfl:    phentermine (ADIPEX-P) 37.5 MG tablet, Take 37.5 mg by mouth every morning., Disp: , Rfl:    valsartan (DIOVAN) 160 MG tablet, Take 160 mg by mouth daily., Disp: , Rfl:    Vitamin D, Ergocalciferol, (DRISDOL) 1.25 MG (50000 UT) CAPS capsule, Take 50,000 Units by mouth every Friday., Disp: , Rfl:   Social History   Tobacco Use  Smoking Status Never  Smokeless Tobacco Never    Allergies  Allergen Reactions   Amlodipine  Swelling    Ankle   Objective:  There were no vitals filed for this visit. There is no height or weight on file to calculate BMI. Constitutional Well developed. Well nourished.  Vascular Dorsalis pedis pulses palpable bilaterally. Posterior tibial pulses palpable bilaterally. Capillary refill normal to all digits.  No cyanosis or clubbing noted. Pedal  hair growth normal.  Neurologic Normal speech. Oriented to person, place, and time. Epicritic sensation to light touch grossly present bilaterally.  Dermatologic Painful ingrowing nail at lateral nail borders of the hallux nail left. No other open wounds. No skin lesions.  Orthopedic: Normal joint ROM without pain or crepitus bilaterally. No visible deformities. No bony tenderness.   Radiographs: None Assessment:   1. Ingrown left big toenail   2. Capsulitis of metatarsophalangeal (MTP) joint of right foot    Plan:  Patient was evaluated and treated and all questions answered.  Right second metatarsophalangeal joint - All questions and concerns were discussed  with the patient in extensive detail given the amount of pain that she is having should benefit from a steroid injection to help decrease inflammatory component associate with pain.  Patient agrees with plan like to proceed with steroid injection -A steroid injection was performed at right second metatarsophalangeal joint using 1% plain Lidocaine  and 10 mg of Kenalog. This was well tolerated.   Ingrown Nail, left -Patient elects to proceed with minor surgery to remove ingrown toenail removal today. Consent reviewed and signed by patient. -Ingrown nail excised. See procedure note. -Educated on post-procedure care including soaking. Written instructions provided and reviewed. -Patient to follow up in 2 weeks for nail check.  Procedure: Excision of Ingrown Toenail Location: Left 1st toe lateral nail borders. Anesthesia: Lidocaine  1% plain; 1.5 mL and Marcaine 0.5% plain; 1.5 mL, digital block. Skin Prep: Betadine. Dressing: Silvadene; telfa; dry, sterile, compression dressing. Technique: Following skin prep, the toe was exsanguinated and a tourniquet was secured at the base of the toe. The affected nail border was freed, split with a nail splitter, and excised. Chemical matrixectomy was then performed with phenol and irrigated out with alcohol. The tourniquet was then removed and sterile dressing applied. Disposition: Patient tolerated procedure well. Patient to return in 2 weeks for follow-up.   No follow-ups on file.

## 2023-12-28 ENCOUNTER — Encounter: Admitting: Physical Therapy

## 2024-04-19 ENCOUNTER — Ambulatory Visit (INDEPENDENT_AMBULATORY_CARE_PROVIDER_SITE_OTHER): Admitting: Podiatry

## 2024-04-19 DIAGNOSIS — M7752 Other enthesopathy of left foot: Secondary | ICD-10-CM | POA: Diagnosis not present

## 2024-04-19 NOTE — Progress Notes (Signed)
 Subjective:  Patient ID: Cynthia Knapp, female    DOB: 1951/05/17,  MRN: [id number]  Chief Complaint  Patient presents with   Toe Pain    73 y.o. female presents with the above complaint.  Patient presents with right second digit capsulitis at the PIPJ joint and hurts with ambulation is with pressure wanted get it evaluated she states that she started noticing discomfort a little while ago she states her great toe is doing good.  Pain scale is 7 out of 10 hurts with ambulation or shoe pressure wanted discuss treatment options for this.   Review of Systems: Negative except as noted in the HPI. Denies N/V/F/Ch.  Past Medical History:  Diagnosis Date   Hypertension     Current Outpatient Medications:    acetaminophen  (TYLENOL ) 650 MG CR tablet, Take 1,300 mg by mouth 2 (two) times daily as needed for pain., Disp: , Rfl:    aspirin  81 MG chewable tablet, CHEW 1 TABLET BY MOUTH DAILY., Disp: , Rfl:    atorvastatin (LIPITOR) 20 MG tablet, Take 20 mg by mouth at bedtime., Disp: , Rfl:    ezetimibe  (ZETIA ) 10 MG tablet, Take 1 tablet (10 mg total) by mouth daily after supper., Disp: 90 tablet, Rfl: 3   famotidine  (PEPCID ) 40 MG tablet, TAKE 1 TABLET BY MOUTH EVERY DAY, Disp: 90 tablet, Rfl: 1   FLUoxetine (PROZAC) 20 MG capsule, Take 20 mg by mouth at bedtime. (Patient not taking: Reported on 10/27/2023), Disp: , Rfl:    hydrochlorothiazide (HYDRODIURIL) 25 MG tablet, Take 25 mg by mouth daily., Disp: , Rfl:    isosorbide  dinitrate (ISORDIL ) 30 MG tablet, TAKE 1 TABLET BY MOUTH 2 TIMES DAILY., Disp: 180 tablet, Rfl: 2   meloxicam (MOBIC) 7.5 MG tablet, Take 7.5 mg by mouth daily., Disp: , Rfl:    metFORMIN (GLUCOPHAGE) 500 MG tablet, Take 1,000 mg by mouth 2 (two) times daily with a meal., Disp: , Rfl:    metoprolol succinate (TOPROL-XL) 100 MG 24 hr tablet, Take 100 mg by mouth every evening. Take with or immediately following a meal., Disp: , Rfl:    nitroGLYCERIN  (NITROSTAT ) 0.4 MG SL  tablet, Place 1 tablet (0.4 mg total) under the tongue every 5 (five) minutes as needed for up to 25 days for chest pain., Disp: 25 tablet, Rfl: 3   Omega-3 Fatty Acids (FISH OIL PO), Take 1,250 mg by mouth daily., Disp: , Rfl:    pantoprazole (PROTONIX) 20 MG tablet, Take 20 mg by mouth daily., Disp: , Rfl:    pantoprazole (PROTONIX) 40 MG tablet, Take 40 mg by mouth daily as needed (GERD). (Patient not taking: Reported on 10/27/2023), Disp: , Rfl:    phentermine (ADIPEX-P) 37.5 MG tablet, Take 37.5 mg by mouth every morning., Disp: , Rfl:    valsartan (DIOVAN) 160 MG tablet, Take 160 mg by mouth daily., Disp: , Rfl:    Vitamin D, Ergocalciferol, (DRISDOL) 1.25 MG (50000 UT) CAPS capsule, Take 50,000 Units by mouth every Friday., Disp: , Rfl:   Social History   Tobacco Use  Smoking Status Never  Smokeless Tobacco Never    Allergies  Allergen Reactions   Amlodipine  Swelling    Ankle   Objective:  There were no vitals filed for this visit. There is no height or weight on file to calculate BMI. Constitutional Well developed. Well nourished.  Vascular Dorsalis pedis pulses palpable bilaterally. Posterior tibial pulses palpable bilaterally. Capillary refill normal to all digits.  No cyanosis or clubbing  noted. Pedal hair growth normal.  Neurologic Normal speech. Oriented to person, place, and time. Epicritic sensation to light touch grossly present bilaterally.  Dermatologic Nails well groomed and normal in appearance. No open wounds. No skin lesions.  Orthopedic: Pain on palpation to the left second digit at the PIPJ joint hammertoe contracture noted.  No open wounds or lesion noted.  Semiflexible in nature.   Radiographs: None Assessment:   1. Capsulitis of toe, left    Plan:  Patient was evaluated and treated and all questions answered.  Left second digit hammertoe deformity semiflexible - All questions and concerns were discussed with the patient in extensive detail  given the amount of pain that she is experiencing she will benefit from aggressive offloading of the digit.  She would benefit from surgical shoe I will place her in a surgical shoe for next few weeks to decrease the acute inflammation if there is no improvement we will discuss steroid injection during next visit if needed.  She may need a hammertoe correction arthroplasty surgery in the future  No follow-ups on file.

## 2024-05-19 ENCOUNTER — Ambulatory Visit: Admitting: Podiatry
